# Patient Record
Sex: Female | Born: 1955 | Hispanic: No | Marital: Married | Smoking: Never smoker
Health system: Southern US, Community
[De-identification: ages and names within clinical notes are randomized; demographics above are authoritative.]

## PROBLEM LIST (undated history)

## (undated) DIAGNOSIS — D649 Anemia, unspecified: Secondary | ICD-10-CM

## (undated) DIAGNOSIS — N289 Disorder of kidney and ureter, unspecified: Secondary | ICD-10-CM

## (undated) DIAGNOSIS — E7800 Pure hypercholesterolemia, unspecified: Secondary | ICD-10-CM

## (undated) DIAGNOSIS — I10 Essential (primary) hypertension: Secondary | ICD-10-CM

## (undated) DIAGNOSIS — J189 Pneumonia, unspecified organism: Secondary | ICD-10-CM

## (undated) DIAGNOSIS — E0590 Thyrotoxicosis, unspecified without thyrotoxic crisis or storm: Secondary | ICD-10-CM

## (undated) HISTORY — PX: ABDOMINAL HYSTERECTOMY: SHX81

## (undated) HISTORY — DX: Essential (primary) hypertension: I10

## (undated) HISTORY — DX: Thyrotoxicosis, unspecified without thyrotoxic crisis or storm: E05.90

## (undated) HISTORY — DX: Disorder of kidney and ureter, unspecified: N28.9

## (undated) HISTORY — DX: Pure hypercholesterolemia, unspecified: E78.00

---

## 2020-03-08 ENCOUNTER — Ambulatory Visit: Payer: Self-pay | Admitting: "Endocrinology

## 2020-11-18 ENCOUNTER — Other Ambulatory Visit (HOSPITAL_COMMUNITY)
Admission: RE | Admit: 2020-11-18 | Discharge: 2020-11-18 | Disposition: A | Discharge status: Home / Self Care | Payer: PRIVATE HEALTH INSURANCE | Source: Other Acute Inpatient Hospital | Attending: Nephrology | Admitting: Nephrology

## 2020-11-18 DIAGNOSIS — N170 Acute kidney failure with tubular necrosis: Secondary | ICD-10-CM | POA: Insufficient documentation

## 2020-11-18 LAB — RENAL FUNCTION PANEL
Albumin: 4.3 g/dL (ref 3.5–5.0)
Anion gap: 8 (ref 5–15)
BUN: 19 mg/dL (ref 8–23)
CO2: 28 mmol/L (ref 22–32)
Calcium: 10.5 mg/dL — ABNORMAL HIGH (ref 8.9–10.3)
Chloride: 104 mmol/L (ref 98–111)
Creatinine, Ser: 1.31 mg/dL — ABNORMAL HIGH (ref 0.44–1.00)
GFR, Estimated: 46 mL/min — ABNORMAL LOW (ref >60)
Glucose, Bld: 87 mg/dL (ref 70–99)
Phosphorus: 2.1 mg/dL — ABNORMAL LOW (ref 2.5–4.6)
Potassium: 3.5 mmol/L (ref 3.5–5.1)
Sodium: 140 mmol/L (ref 135–145)

## 2020-11-25 ENCOUNTER — Encounter: Payer: Self-pay | Admitting: Urology

## 2020-11-25 ENCOUNTER — Ambulatory Visit (INDEPENDENT_AMBULATORY_CARE_PROVIDER_SITE_OTHER): Payer: PRIVATE HEALTH INSURANCE | Admitting: Urology

## 2020-11-25 ENCOUNTER — Other Ambulatory Visit: Payer: Self-pay

## 2020-11-25 VITALS — BP 144/76 | HR 80 | Temp 98.2°F | Ht 63.0 in | Wt 177.0 lb

## 2020-11-25 DIAGNOSIS — N200 Calculus of kidney: Secondary | ICD-10-CM | POA: Diagnosis not present

## 2020-11-25 DIAGNOSIS — N1330 Unspecified hydronephrosis: Secondary | ICD-10-CM | POA: Diagnosis not present

## 2020-11-25 DIAGNOSIS — N281 Cyst of kidney, acquired: Secondary | ICD-10-CM

## 2020-11-25 DIAGNOSIS — R8271 Bacteriuria: Secondary | ICD-10-CM

## 2020-11-25 DIAGNOSIS — N201 Calculus of ureter: Secondary | ICD-10-CM

## 2020-11-25 LAB — URINALYSIS, ROUTINE W REFLEX MICROSCOPIC
Bilirubin, UA: NEGATIVE
Glucose, UA: NEGATIVE
Ketones, UA: NEGATIVE
Nitrite, UA: NEGATIVE
RBC, UA: NEGATIVE
Specific Gravity, UA: 1.015 (ref 1.005–1.030)
Urobilinogen, Ur: 0.2 mg/dL (ref 0.2–1.0)
pH, UA: 6.5 (ref 5.0–7.5)

## 2020-11-25 LAB — MICROSCOPIC EXAMINATION: RBC, Urine: NONE SEEN /hpf (ref 0–2)

## 2020-11-25 NOTE — Progress Notes (Signed)
H&P  Chief Complaint: Hydronephrosis, renal stones, ureteral stones, renal nodule/cyst  History of Present Illness:  Charlene Kennedy is a complex urologic patient referred for above issues. She did not bring any of her images. She was referred to nephrology, Dr. Theador Hawthorne for creatinine of 1.3-1.6. Ultrasound was done at [place] 11/23/2020 which revealed moderate right-sided hydronephrosis and dilation of the proximal ureter with right-sided cortical thinning. There was also a 1.3 cm cyst within the interpolar of the left kidney as well as a thinly septated cyst in the lower pole of the left kidney measuring 1.2 cm. There were small scattered echogenic foci in both kidneys possible stones. Of note the ultrasound report mentions comparison to a 01/11/2020 renal ultrasound that also showed right hydronephrosis.  She was referred to [facility].   There is report in the nephrology notes of a Sep 2021 CT scan C/A/P showing patient had 2 large stones on the right kidney and marked hydroureter with advanced obstructive uropathy of the right calyceal system. There is hydroureteral development complete to the right vesicoureteral junction. There are 2 stones in the distal right ureter. On the left kidney it showed patient had 20 mm solid mass and nephrolithiasis. The left kidney is negative for obstruction. There is upper medial demonstration of solid well-circumscribed 20 mm mass. Nephrolithiasis was noted. Interestingly, she got this scan on her own from a company in [place].   She has never has any kidney stones or passed a stone.   UA today with no red blood cells. 6-10 white cells. Many bacteria. Yeast present. No gross hematuria. She has no dysuria.       Past Medical History:  Diagnosis Date  . High cholesterol   . Hypertension   . Hyperthyroidism   . Kidney disease    History reviewed. No pertinent surgical history.  Home Medications:  (Not in a hospital admission)  Allergies:  Allergies  Allergen  Reactions  . Ace Inhibitors Cough    History reviewed. No pertinent family history. Social History:  reports that she has never smoked. She has never used smokeless tobacco. No history on file for alcohol use and drug use.  ROS: A complete review of systems was performed.  All systems are negative except for pertinent findings as noted. ROS   Physical Exam:  Vital signs in last 24 hours: @VSRANGES @ General:  Alert and oriented, No acute distress HEENT: Normocephalic, atraumatic Neck: No JVD or lymphadenopathy Cardiovascular: Regular rate and rhythm Lungs: Regular rate and effort Abdomen: Soft, nontender, nondistended, no abdominal masses Back: No CVA tenderness Extremities: No edema Neurologic: Grossly intact  Laboratory Data:  No results found for this or any previous visit (from the past 24 hour(s)). No results found for this or any previous visit (from the past 240 hour(s)). Creatinine: Recent Labs    11/18/20 1217  CREATININE 1.31*    Impression/Assessment/plan:  #1 right hydronephrosis-this sounds longstanding, could be from right distal stones. We discussed the parenchymal thinning and she could have long standing obstruction which might not improve with stone removal.   #2 right ureteral stones-check KUB. We discussed the nature risks benefits and alternatives to continued surveillance, shockwave lithotripsy or ureteroscopy with laser lithotripsy and stent. We will plan 1 of these procedures of stones remain present. She would like to proceed with ESWL if feasible.   #3 right and left renal stones-check KUB  #4 bacteriuria- urine sent for culture.  #5 renal mass/cyst- CT describes more of a mass, ultrasound seems to confirm  more of a cyst. These lesions are less than 2 cm. Will monitor. Disc nature of solid and cystic mass - surv, ablation, partial etc. Pt instructed to bring [place] and CT disc so that I can review.   [name] 11/25/2020, 11:49 AM

## 2020-11-25 NOTE — Progress Notes (Signed)
Urological Symptom Review  Patient is experiencing the following symptoms: Get up at night to urinate Weak stream  Kidney stones  Review of Systems  Gastrointestinal (upper)  : Negative for upper GI symptoms  Gastrointestinal (lower) : Negative for lower GI symptoms  Constitutional : Fatigue  Skin: Negative for skin symptoms  Eyes: Negative for eye symptoms  Ear/Nose/Throat : Negative for Ear/Nose/Throat symptoms  Hematologic/Lymphatic: Negative for Hematologic/Lymphatic symptoms  Cardiovascular : Negative for cardiovascular symptoms  Respiratory : Negative for respiratory symptoms  Endocrine: Negative for endocrine symptoms  Musculoskeletal: Negative for musculoskeletal symptoms  Neurological: Negative for neurological symptoms  Psychologic: Negative for psychiatric symptoms

## 2020-11-25 NOTE — Patient Instructions (Signed)
Hydronephrosis  Hydronephrosis is the swelling of one or both kidneys due to a blockage that stops urine from flowing out of the body. Kidneys filter waste from the blood and produce urine. This condition can lead to kidney failure and may become life threatening if not treated promptly. What are the causes? Common causes of this condition include:  Problems that occur when a baby is developing in the womb (congenital defect). These can include problems: ? In the kidneys. ? In the tubes that drain urine from the kidneys into the bladder (ureters).  Kidney stones.  Bladder infection.  An enlarged prostate gland.  Scar tissue from a previous surgery or injury.  A blood clot.  A tumor or cyst in the abdomen or pelvis.  Cancer of the prostate, bladder, uterus, ovary, or colon. What are the signs or symptoms? Symptoms of this condition include:  Pain or discomfort in your side (flank).  Pain and swelling in your abdomen.  Nausea and vomiting.  Fever.  Pain when passing urine.  Feelings of urgency when you need to urinate.  Urinating more often than normal. In some cases, you may not have any symptoms. How is this diagnosed? This condition may be diagnosed based on:  Your symptoms and medical history.  A physical exam.  Blood and urine tests.  Imaging tests, such as an ultrasound, CT scan, or MRI.  A procedure in which a scope is inserted into the urethra and used to view parts of the urinary tract and bladder (cystoscopy). How is this treated? Treatment for this condition depends on where the blockage is, how long it has been there, and what caused it. The goal of treatment is to remove the blockage. Treatment may include:  Antibiotic medicines to treat or prevent infection.  A procedure to place a small, thin tube (stent) into a blocked ureter. The stent will keep the ureter open so that urine can drain through it.  A nonsurgical procedure that crushes kidney  stones with shock waves (extracorporeal shock wave lithotripsy).  If kidney failure occurs, treatment may include dialysis or a kidney transplant. Follow these instructions at home:   Take over-the-counter and prescription medicines only as told by your health care provider.  Rest and return to your normal activities as told by your health care provider. Ask your health care provider what activities are safe for you.  Drink enough fluid to keep your urine pale yellow.  If you were prescribed an antibiotic medicine, take it exactly as told by your health care provider. Do not stop taking the antibiotic even if you start to feel better.  Keep all follow-up visits as told by your health care provider. This is important. Contact a health care provider if:  You continue to have symptoms after treatment.  You develop new symptoms.  Your urine becomes cloudy or bloody.  You have a fever. Get help right away if:  You have severe flank or abdominal pain.  You cannot drink fluids without vomiting. Summary  Hydronephrosis is the swelling of one or both kidneys due to a blockage that stops urine from flowing out of the body.  Hydronephrosis can lead to kidney failure and may become life threatening if not treated promptly.  The goal of treatment is to treat the cause of the blockage. It may include insertion of stent into a blocked ureter, a procedure to treat kidney stones, and antibiotic medicines.  Follow your health care provider's instructions for taking care of yourself at   home, including instructions about drinking fluids, taking medicines, and limiting activities. This information is not intended to replace advice given to you by your health care provider. Make sure you discuss any questions you have with your health care provider. Document Revised: 11/16/2017 Document Reviewed: 11/16/2017 Elsevier Patient Education  2020 [place]. Kidney Stones Kidney stones are rock-like  masses that form inside of the kidneys. Kidneys are organs that make pee (urine). A kidney stone may move into other parts of the urinary tract, including:  The tubes that connect the kidneys to the bladder (ureters).  The bladder.  The tube that carries urine out of the body (urethra). Kidney stones can cause very bad pain and can block the flow of pee. The stone usually leaves your body (passes) through your pee. You may need to have a doctor take out the stone. What are the causes? Kidney stones may be caused by:  A condition in which certain glands make too much parathyroid hormone (primary hyperparathyroidism).  A buildup of a type of crystals in the bladder made of a chemical called uric acid. The body makes uric acid when you eat certain foods.  Narrowing (stricture) of one or both of the ureters.  A kidney blockage that you were born with.  Past surgery on the kidney or the ureters, such as gastric bypass surgery. What increases the risk? You are more likely to develop this condition if:  You have had a kidney stone in the past.  You have a family history of kidney stones.  You do not drink enough water.  You eat a diet that is high in protein, salt (sodium), or sugar.  You are overweight or very overweight (obese). What are the signs or symptoms? Symptoms of a kidney stone may include:  Pain in the side of the belly, right below the ribs (flank pain). Pain usually spreads (radiates) to the groin.  Needing to pee often or right away (urgently).  Pain when going pee (urinating).  Blood in your pee (hematuria).  Feeling like you may vomit (nauseous).  Vomiting.  Fever and chills. How is this treated? Treatment depends on the size, location, and makeup of the kidney stones. The stones will often pass out of the body through peeing. You may need to:  Drink more fluid to help pass the stone. In some cases, you may be given fluids through an IV tube put into one  of your veins at the hospital.  Take medicine for pain.  Make changes in your diet to help keep kidney stones from coming back. Sometimes, medical procedures are needed to remove a kidney stone. This may involve:  A procedure to break up kidney stones using a beam of light (laser) or shock waves.  Surgery to remove the kidney stones. Follow these instructions at home: Medicines  Take over-the-counter and prescription medicines only as told by your doctor.  Ask your doctor if the medicine prescribed to you requires you to avoid driving or using heavy machinery. Eating and drinking  Drink enough fluid to keep your pee pale yellow. You may be told to drink at least 8-10 glasses of water each day. This will help you pass the stone.  If told by your doctor, change your diet. This may include: ? Limiting how much salt you eat. ? Eating more fruits and vegetables. ? Limiting how much meat, poultry, fish, and eggs you eat.  Follow instructions from your doctor about eating or drinking restrictions. General instructions  Collect pee samples as told by your doctor. You may need to collect a pee sample: ? 24 hours after a stone comes out. ? 8-12 weeks after a stone comes out, and every 6-12 months after that.  Strain your pee every time you pee (urinate), for as long as told. Use the strainer that your doctor recommends.  Do not throw out the stone. Keep it so that it can be tested by your doctor.  Keep all follow-up visits as told by your doctor. This is important. You may need follow-up tests. How is this prevented? To prevent another kidney stone:  Drink enough fluid to keep your pee pale yellow. This is the best way to prevent kidney stones.  Eat healthy foods.  Avoid certain foods as told by your doctor. You may be told to eat less protein.  Stay at a healthy weight. Where to find more information  [place] ([facility]): www.kidney.[place]  [facility]): www.urologyhealth.org Contact a doctor if:  You have pain that gets worse or does not get better with medicine. Get help right away if:  You have a fever or chills.  You get very bad pain.  You get new pain in your belly (abdomen).  You pass out (faint).  You cannot pee. Summary  Kidney stones are rock-like masses that form inside of the kidneys.  Kidney stones can cause very bad pain and can block the flow of pee.  The stones will often pass out of the body through peeing.  Drink enough fluid to keep your pee pale yellow. This information is not intended to replace advice given to you by your health care provider. Make sure you discuss any questions you have with your health care provider. Document Revised: 03/24/2019 Document Reviewed: 03/24/2019 Elsevier Patient Education  [place].

## 2020-11-27 LAB — URINE CULTURE

## 2020-11-29 ENCOUNTER — Telehealth: Payer: Self-pay

## 2020-11-29 NOTE — Telephone Encounter (Signed)
Pt notified urine cx was negative.

## 2020-11-29 NOTE — Telephone Encounter (Signed)
-----   Message from Cleon Gustin, MD sent at 11/29/2020 10:29 AM EST ----- negative ----- Message ----- From: Valentina Lucks, LPN Sent: 5/88/5027   9:26 AM EST To: Cleon Gustin, MD  Pls review.

## 2020-11-30 ENCOUNTER — Other Ambulatory Visit (HOSPITAL_COMMUNITY): Payer: Self-pay | Admitting: Nephrology

## 2020-11-30 ENCOUNTER — Telehealth: Payer: Self-pay

## 2020-11-30 DIAGNOSIS — E210 Primary hyperparathyroidism: Secondary | ICD-10-CM

## 2020-11-30 NOTE — Telephone Encounter (Signed)
-----   Message from [name], MD sent at 11/30/2020  9:19 AM EST ----- Charlene Kennedy, please have Charlene Kennedy get a KUB this week or ASAP at [facility]. I might need to set her up for ESWL or ureteroscopy. Thanks!  Dr Johnette Abraham

## 2020-11-30 NOTE — Telephone Encounter (Signed)
Pt called and notified of needing KUB prior to moving forward with procedures. Pt asking if this could be done at [facility] and I instructed the patient we are unable to access images done through [place] and Dr. Junious Silk stressed her xray needed to be done at a [facility] facility- [facility] being closet.  Pt states she can not promise she will go but will try.

## 2020-12-01 ENCOUNTER — Other Ambulatory Visit: Payer: Self-pay

## 2020-12-01 ENCOUNTER — Ambulatory Visit (HOSPITAL_COMMUNITY)
Admission: RE | Admit: 2020-12-01 | Discharge: 2020-12-01 | Disposition: A | Discharge status: Home / Self Care | Payer: PRIVATE HEALTH INSURANCE | Source: Ambulatory Visit | Attending: Urology | Admitting: Urology

## 2020-12-01 DIAGNOSIS — N200 Calculus of kidney: Secondary | ICD-10-CM

## 2020-12-01 DIAGNOSIS — N201 Calculus of ureter: Secondary | ICD-10-CM

## 2020-12-07 NOTE — Telephone Encounter (Signed)
Message left for patient to call to discuss the kub findings and ask patient if she would like to proceed with the doctors recommendations.

## 2020-12-07 NOTE — Telephone Encounter (Signed)
Please see note if patient returns call today.

## 2020-12-07 NOTE — Telephone Encounter (Signed)
Patient would like to proceed with ureteroscopy- she asked that you call her to discuss the actual surgery. 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

## 2020-12-09 ENCOUNTER — Ambulatory Visit: Payer: PRIVATE HEALTH INSURANCE | Admitting: Urology

## 2020-12-09 ENCOUNTER — Encounter (HOSPITAL_COMMUNITY): Payer: PRIVATE HEALTH INSURANCE

## 2020-12-09 ENCOUNTER — Encounter (HOSPITAL_COMMUNITY): Payer: Self-pay

## 2020-12-13 NOTE — Telephone Encounter (Signed)
I spoke with this patient.  I answered the general questions about surgery and when we could schedule- she is on your schedule 2/4 to address additional questions regarding the procedure Dr. Junious Silk has suggested she have.   I can have her scheduled for 2/23 for this procedure but she would like to speak to you before.

## 2020-12-23 ENCOUNTER — Ambulatory Visit (INDEPENDENT_AMBULATORY_CARE_PROVIDER_SITE_OTHER): Payer: PRIVATE HEALTH INSURANCE | Admitting: Urology

## 2020-12-23 ENCOUNTER — Ambulatory Visit (HOSPITAL_COMMUNITY)
Admission: RE | Admit: 2020-12-23 | Discharge: 2020-12-23 | Disposition: A | Discharge status: Home / Self Care | Payer: PRIVATE HEALTH INSURANCE | Source: Ambulatory Visit | Attending: Urology | Admitting: Urology

## 2020-12-23 ENCOUNTER — Encounter: Payer: Self-pay | Admitting: Urology

## 2020-12-23 ENCOUNTER — Other Ambulatory Visit: Payer: Self-pay

## 2020-12-23 ENCOUNTER — Ambulatory Visit: Payer: PRIVATE HEALTH INSURANCE | Admitting: Urology

## 2020-12-23 VITALS — BP 123/74 | HR 68 | Temp 98.6°F | Ht 63.0 in | Wt 175.0 lb

## 2020-12-23 DIAGNOSIS — N1330 Unspecified hydronephrosis: Secondary | ICD-10-CM | POA: Diagnosis present

## 2020-12-23 LAB — URINALYSIS, ROUTINE W REFLEX MICROSCOPIC
Bilirubin, UA: NEGATIVE
Glucose, UA: NEGATIVE
Ketones, UA: NEGATIVE
Nitrite, UA: NEGATIVE
Specific Gravity, UA: 1.015 (ref 1.005–1.030)
Urobilinogen, Ur: 0.2 mg/dL (ref 0.2–1.0)
pH, UA: 7 (ref 5.0–7.5)

## 2020-12-23 LAB — MICROSCOPIC EXAMINATION
Epithelial Cells (non renal): >10 /hpf — AB (ref 0–10)
Renal Epithel, UA: NONE SEEN /hpf

## 2020-12-23 NOTE — H&P (View-Only) (Signed)
12/23/2020 12:27 PM   Charlene Kennedy 04/26/1956 [id number]  Referring provider: No referring provider defined for this encounter.  Right hydronephrosis  HPI: Ms Charlene Kennedy is a 65yo here for evaluation of right hydronephrosis. She has a hx of nephrolithiasis and had a renal US in Dec which showed right hydronephrosis. KUB from 11/2020 showed bilateral renal calculi. She has mild intermittent right flank pain.    PMH: Past Medical History:  Diagnosis Date  . High cholesterol   . Hypertension   . Hyperthyroidism   . Kidney disease     Surgical History: History reviewed. No pertinent surgical history.  Home Medications:  Allergies as of 12/23/2020      Reactions   Ace Inhibitors Cough      Medication List       Accurate as of December 23, 2020 12:27 PM. If you have any questions, ask your nurse or doctor.        STOP taking these medications   cetirizine 10 MG tablet Commonly known as: ZYRTEC Stopped by: Nicolette Bang, MD   cinacalcet 30 MG tablet Commonly known as: SENSIPAR Stopped by: Nicolette Bang, MD     TAKE these medications   amLODipine 10 MG tablet Commonly known as: NORVASC Take 10 mg by mouth daily.   aspirin 81 MG EC tablet Aspir-81 mg tablet,delayed release   Cholecalciferol 1.25 MG (50000 UT) capsule cholecalciferol (vitamin D3) 1,250 mcg (50,000 unit) capsule  TAKE ONE CAPSULE BY MOUTH EVERY WEEK   cloNIDine 0.1 MG tablet Commonly known as: CATAPRES Take 0.1 mg by mouth 2 (two) times daily.   fluticasone 50 MCG/ACT nasal spray Commonly known as: FLONASE   olmesartan 40 MG tablet Commonly known as: BENICAR Take 40 mg by mouth daily.       Allergies:  Allergies  Allergen Reactions  . Ace Inhibitors Cough    Family History: History reviewed. No pertinent family history.  Social History:  reports that she has never smoked. She has never used smokeless tobacco. No history on file for alcohol use and drug use.  ROS: All  other review of systems were reviewed and are negative except what is noted above in HPI  Physical Exam: BP 123/74   Pulse 68   Temp 98.6 F (37 C)   Ht 5\' 3"  (1.6 m)   Wt 175 lb (79.4 kg)   BMI 31.00 kg/m   Constitutional:  Alert and oriented, No acute distress. HEENT:  AT, moist mucus membranes.  Trachea midline, no masses. Cardiovascular: No clubbing, cyanosis, or edema. Respiratory: Normal respiratory effort, no increased work of breathing. GI: Abdomen is soft, nontender, nondistended, no abdominal masses GU: No CVA tenderness.  Lymph: No cervical or inguinal lymphadenopathy. Skin: No rashes, bruises or suspicious lesions. Neurologic: Grossly intact, no focal deficits, moving all 4 extremities. Psychiatric: Normal mood and affect.  Laboratory Data: No results found for: WBC, HGB, HCT, MCV, PLT  Lab Results  Component Value Date   CREATININE 1.31 (H) 11/18/2020    No results found for: PSA  No results found for: TESTOSTERONE  No results found for: HGBA1C  Urinalysis    Component Value Date/Time   APPEARANCEUR Cloudy (A) 11/25/2020 1115   GLUCOSEU Negative 11/25/2020 1115   BILIRUBINUR Negative 11/25/2020 1115   PROTEINUR 1+ (A) 11/25/2020 1115   NITRITE Negative 11/25/2020 1115   LEUKOCYTESUR 1+ (A) 11/25/2020 1115    Lab Results  Component Value Date   LABMICR See below: 11/25/2020   WBCUA 6-10 (A)  11/25/2020   LABEPIT 0-10 11/25/2020   BACTERIA Many (A) 11/25/2020    Pertinent Imaging: KUB 12/01/2020: IMages reviewed and discussed with the patient Results for orders placed during the hospital encounter of 12/01/20  DG Abd 1 View  Narrative CLINICAL DATA:  Ureteral stone.  Abdominal pain.  EXAM: ABDOMEN - 1 VIEW  COMPARISON:  No prior.  FINDINGS: Soft tissue structures are unremarkable. Stool noted throughout the colon. No bowel distention or free air. Multiple bilateral renal stones and or nephrocalcinosis. Multiple prominent  pelvic calcifications are noted. Although these may represent phleboliths, distal ureteral stones cannot be excluded. Calcifications in the right lower pelvis are particularly prominent with the largest measuring 1.2 cm. The possibility of a calcified adnexal process including dermoid cannot be excluded. Pelvic ultrasound can be obtained for further evaluation as needed. Small sclerotic density noted over the left pelvis, most likely benign bone island. Thoracolumbar spine scoliosis. Degenerative change lumbar spine and both hips.  IMPRESSION: 1. Multiple bilateral renal stones and or nephrocalcinosis. 2. Multiple prominent pelvic calcifications are noted. Although these may represent phleboliths, distal ureteral stones cannot be excluded. Calcifications in the right lower pelvis are particularly prominent with the largest measuring 1.2 cm. The possibility of a calcified adnexal process including dermoid cannot be excluded. Pelvic ultrasound can be obtained for further evaluation.   Electronically Signed By: Marcello Moores  Register On: 12/01/2020 10:23  No results found for this or any previous visit.  No results found for this or any previous visit.  No results found for this or any previous visit.  No results found for this or any previous visit.  No results found for this or any previous visit.  No results found for this or any previous visit.  No results found for this or any previous visit.   Assessment & Plan:    1. Hydronephrosis, unspecified hydronephrosis type -CT stone study to evaluate for ureteral calculi.  - Urinalysis, Routine w reflex microscopic   No follow-ups on file.  Nicolette Bang, MD  [facility] [place]

## 2020-12-23 NOTE — Progress Notes (Signed)
Urological Symptom Review  Patient is experiencing the following symptoms: Kidney stones   Review of Systems  Gastrointestinal (upper)  : Negative for upper GI symptoms  Gastrointestinal (lower) : Negative for lower GI symptoms  Constitutional : Negative for symptoms  Skin: Negative for skin symptoms  Eyes: Negative for eye symptoms  Ear/Nose/Throat : Negative for Ear/Nose/Throat symptoms  Hematologic/Lymphatic: Negative for Hematologic/Lymphatic symptoms  Cardiovascular : Negative for cardiovascular symptoms  Respiratory : Negative for respiratory symptoms  Endocrine: Negative for endocrine symptoms  Musculoskeletal: Negative for musculoskeletal symptoms  Neurological: Negative for neurological symptoms  Psychologic: Negative for psychiatric symptoms

## 2020-12-23 NOTE — Progress Notes (Signed)
 12/23/2020 12:27 PM   Charlene Kennedy 08/07/1956 [id number]  Referring provider: No referring provider defined for this encounter.  Right hydronephrosis  HPI: Charlene Kennedy is a 64yo here for evaluation of right hydronephrosis. She has a hx of nephrolithiasis and had a renal US in Dec which showed right hydronephrosis. KUB from 11/2020 showed bilateral renal calculi. She has mild intermittent right flank pain.    PMH: Past Medical History:  Diagnosis Date  . High cholesterol   . Hypertension   . Hyperthyroidism   . Kidney disease     Surgical History: History reviewed. No pertinent surgical history.  Home Medications:  Allergies as of 12/23/2020      Reactions   Ace Inhibitors Cough      Medication List       Accurate as of December 23, 2020 12:27 PM. If you have any questions, ask your nurse or doctor.        STOP taking these medications   cetirizine 10 MG tablet Commonly known as: ZYRTEC Stopped by: Jocelin Schuelke, MD   cinacalcet 30 MG tablet Commonly known as: SENSIPAR Stopped by: Joselin Crandell, MD     TAKE these medications   amLODipine 10 MG tablet Commonly known as: NORVASC Take 10 mg by mouth daily.   aspirin 81 MG EC tablet Aspir-81 mg tablet,delayed release   Cholecalciferol 1.25 MG (50000 UT) capsule cholecalciferol (vitamin D3) 1,250 mcg (50,000 unit) capsule  TAKE ONE CAPSULE BY MOUTH EVERY WEEK   cloNIDine 0.1 MG tablet Commonly known as: CATAPRES Take 0.1 mg by mouth 2 (two) times daily.   fluticasone 50 MCG/ACT nasal spray Commonly known as: FLONASE   olmesartan 40 MG tablet Commonly known as: BENICAR Take 40 mg by mouth daily.       Allergies:  Allergies  Allergen Reactions  . Ace Inhibitors Cough    Family History: History reviewed. No pertinent family history.  Social History:  reports that she has never smoked. She has never used smokeless tobacco. No history on file for alcohol use and drug use.  ROS: All  other review of systems were reviewed and are negative except what is noted above in HPI  Physical Exam: BP 123/74   Pulse 68   Temp 98.6 F (37 C)   Ht 5' 3" (1.6 m)   Wt 175 lb (79.4 kg)   BMI 31.00 kg/m   Constitutional:  Alert and oriented, No acute distress. HEENT: [place] AT, moist mucus membranes.  Trachea midline, no masses. Cardiovascular: No clubbing, cyanosis, or edema. Respiratory: Normal respiratory effort, no increased work of breathing. GI: Abdomen is soft, nontender, nondistended, no abdominal masses GU: No CVA tenderness.  Lymph: No cervical or inguinal lymphadenopathy. Skin: No rashes, bruises or suspicious lesions. Neurologic: Grossly intact, no focal deficits, moving all 4 extremities. Psychiatric: Normal mood and affect.  Laboratory Data: No results found for: WBC, HGB, HCT, MCV, PLT  Lab Results  Component Value Date   CREATININE 1.31 (H) 11/18/2020    No results found for: PSA  No results found for: TESTOSTERONE  No results found for: HGBA1C  Urinalysis    Component Value Date/Time   APPEARANCEUR Cloudy (A) 11/25/2020 1115   GLUCOSEU Negative 11/25/2020 1115   BILIRUBINUR Negative 11/25/2020 1115   PROTEINUR 1+ (A) 11/25/2020 1115   NITRITE Negative 11/25/2020 1115   LEUKOCYTESUR 1+ (A) 11/25/2020 1115    Lab Results  Component Value Date   LABMICR See below: 11/25/2020   WBCUA 6-10 (A)   11/25/2020   LABEPIT 0-10 11/25/2020   BACTERIA Many (A) 11/25/2020    Pertinent Imaging: KUB 12/01/2020: IMages reviewed and discussed with the patient Results for orders placed during the hospital encounter of 12/01/20  DG Abd 1 View  Narrative CLINICAL DATA:  Ureteral stone.  Abdominal pain.  EXAM: ABDOMEN - 1 VIEW  COMPARISON:  No prior.  FINDINGS: Soft tissue structures are unremarkable. Stool noted throughout the colon. No bowel distention or free air. Multiple bilateral renal stones and or nephrocalcinosis. Multiple prominent  pelvic calcifications are noted. Although these may represent phleboliths, distal ureteral stones cannot be excluded. Calcifications in the right lower pelvis are particularly prominent with the largest measuring 1.2 cm. The possibility of a calcified adnexal process including dermoid cannot be excluded. Pelvic ultrasound can be obtained for further evaluation as needed. Small sclerotic density noted over the left pelvis, most likely benign bone island. Thoracolumbar spine scoliosis. Degenerative change lumbar spine and both hips.  IMPRESSION: 1. Multiple bilateral renal stones and or nephrocalcinosis. 2. Multiple prominent pelvic calcifications are noted. Although these may represent phleboliths, distal ureteral stones cannot be excluded. Calcifications in the right lower pelvis are particularly prominent with the largest measuring 1.2 cm. The possibility of a calcified adnexal process including dermoid cannot be excluded. Pelvic ultrasound can be obtained for further evaluation.   Electronically Signed By: Marcello Moores  Register On: 12/01/2020 10:23  No results found for this or any previous visit.  No results found for this or any previous visit.  No results found for this or any previous visit.  No results found for this or any previous visit.  No results found for this or any previous visit.  No results found for this or any previous visit.  No results found for this or any previous visit.   Assessment & Plan:    1. Hydronephrosis, unspecified hydronephrosis type -CT stone study to evaluate for ureteral calculi.  - Urinalysis, Routine w reflex microscopic   No follow-ups on file.  Nicolette Bang, MD  [facility] [place]

## 2021-01-16 NOTE — Patient Instructions (Signed)
Charlene Kennedy  01/16/2021     @PREFPERIOPPHARMACY @   Your procedure is scheduled on 01/19/2021   Report to [facility] at  1300 (1:00)  P.M.   Call this number if you have problems the morning of surgery:  [phone]   Remember:  Do not eat or drink after midnight.                        Take these medicines the morning of surgery with A SIP OF WATER  Amlodipine, clonidine.     Do not wear jewelry, make-up or nail polish.  Do not wear lotions, powders, or perfumes, or deodorant.  Do not shave 48 hours prior to surgery.  Men may shave face and neck.  Do not bring valuables to the hospital.  [facility] is not responsible for any belongings or valuables.  Contacts, dentures or bridgework may not be worn into surgery.  Leave your suitcase in the car.  After surgery it may be brought to your room.  For patients admitted to the hospital, discharge time will be determined by your treatment team.  Patients discharged the day of surgery will not be allowed to drive home and must have someone with th em for 24 hours.  Place clean sheets on your bed the night before your surgery. DO NOT sleep with pets this night.  Shower the morning of your surgery. After your shower, dry off with a clean towel, put in clean, comfortable clothing and brush your teeth.    Special instructions:    DO NOT smoke tobacco or vape the morning of your procedure.   Please read over the following fact sheets that you were given. Coughing and Deep Breathing, Surgical Site Infection Prevention, Anesthesia Post-op Instructions and Care and Recovery After Surgery       Ureteral Stent Implantation, Care After This sheet gives you information about how to care for yourself after your procedure. Your health care provider may also give you more specific instructions. If you have problems or questions, contact your health care provider. What can I expect after the procedure? After the procedure, it is  common to have:  Nausea.  Mild pain when you urinate. You may feel this pain in your lower back or lower abdomen. The pain should stop within a few minutes after you urinate. This may last for up to 1 week.  A small amount of blood in your urine for several days. Follow these instructions at home: Medicines  Take over-the-counter and prescription medicines only as told by your health care provider.  If you were prescribed an antibiotic medicine, take it as told by your health care provider. Do not stop taking the antibiotic even if you start to feel better.  Do not drive for 24 hours if you were given a sedative during your procedure.  Ask your health care provider if the medicine prescribed to you requires you to avoid driving or using heavy machinery. Activity  Rest as told by your health care provider.  Avoid sitting for a long time without moving. Get up to take short walks every 1-2 hours. This is important to improve blood flow and breathing. Ask for help if you feel weak or unsteady.  Return to your normal activities as told by your health care provider. Ask your health care provider what activities are safe for you. General instructions  Watch for any blood in your urine. Call your  health care provider if the amount of blood in your urine increases.  If you have a catheter: ? Follow instructions from your health care provider about taking care of your catheter and collection bag. ? Do not take baths, swim, or use a hot tub until your health care provider approves. Ask your health care provider if you may take showers. You may only be allowed to take sponge baths.  Drink enough fluid to keep your urine pale yellow.  Do not use any products that contain nicotine or tobacco, such as cigarettes, e-cigarettes, and chewing tobacco. These can delay healing after surgery. If you need help quitting, ask your health care provider.  Keep all follow-up visits as told by your health  care provider. This is important.   Contact a health care provider if:  You have pain that gets worse or does not get better with medicine, especially pain when you urinate.  You have difficulty urinating.  You feel nauseous or you vomit repeatedly during a period of more than 2 days after the procedure. Get help right away if:  Your urine is dark red or has blood clots in it.  You are leaking urine (have incontinence).  The end of the stent comes out of your urethra.  You cannot urinate.  You have sudden, sharp, or severe pain in your abdomen or lower back.  You have a fever.  You have swelling or pain in your legs.  You have difficulty breathing. Summary  After the procedure, it is common to have mild pain when you urinate that goes away within a few minutes after you urinate. This may last for up to 1 week.  Watch for any blood in your urine. Call your health care provider if the amount of blood in your urine increases.  Take over-the-counter and prescription medicines only as told by your health care provider.  Drink enough fluid to keep your urine pale yellow. This information is not intended to replace advice given to you by your health care provider. Make sure you discuss any questions you have with your health care provider. Document Revised: 08/12/2018 Document Reviewed: 08/13/2018 Elsevier Patient Education  2021 [place] After This sheet gives you information about how to care for yourself after your procedure. Your health care provider may also give you more specific instructions. If you have problems or questions, contact your health care provider. What can I expect after the procedure? After the procedure, it is common to have:  Tiredness.  Forgetfulness about what happened after the procedure.  Impaired judgment for important decisions.  Nausea or vomiting.  Some difficulty with balance. Follow these instructions  at home: For the time period you were told by your health care provider:  Rest as needed.  Do not participate in activities where you could fall or become injured.  Do not drive or use machinery.  Do not drink alcohol.  Do not take sleeping pills or medicines that cause drowsiness.  Do not make important decisions or sign legal documents.  Do not take care of children on your own.      Eating and drinking  Follow the diet that is recommended by your health care provider.  Drink enough fluid to keep your urine pale yellow.  If you vomit: ? Drink water, juice, or soup when you can drink without vomiting. ? Make sure you have little or no nausea before eating solid foods. General instructions  Have a responsible adult  stay with you for the time you are told. It is important to have someone help care for you until you are awake and alert.  Take over-the-counter and prescription medicines only as told by your health care provider.  If you have sleep apnea, surgery and certain medicines can increase your risk for breathing problems. Follow instructions from your health care provider about wearing your sleep device: ? Anytime you are sleeping, including during daytime naps. ? While taking prescription pain medicines, sleeping medicines, or medicines that make you drowsy.  Avoid smoking.  Keep all follow-up visits as told by your health care provider. This is important. Contact a health care provider if:  You keep feeling nauseous or you keep vomiting.  You feel light-headed.  You are still sleepy or having trouble with balance after 24 hours.  You develop a rash.  You have a fever.  You have redness or swelling around the IV site. Get help right away if:  You have trouble breathing.  You have new-onset confusion at home. Summary  For several hours after your procedure, you may feel tired. You may also be forgetful and have poor judgment.  Have a responsible  adult stay with you for the time you are told. It is important to have someone help care for you until you are awake and alert.  Rest as told. Do not drive or operate machinery. Do not drink alcohol or take sleeping pills.  Get help right away if you have trouble breathing, or if you suddenly become confused. This information is not intended to replace advice given to you by your health care provider. Make sure you discuss any questions you have with your health care provider. Document Revised: 07/21/2020 Document Reviewed: 10/08/2019 Elsevier Patient Education  2021 [employer].

## 2021-01-17 ENCOUNTER — Encounter (HOSPITAL_COMMUNITY): Payer: Self-pay

## 2021-01-17 ENCOUNTER — Other Ambulatory Visit (HOSPITAL_COMMUNITY)
Admission: RE | Admit: 2021-01-17 | Discharge: 2021-01-17 | Disposition: A | Discharge status: Home / Self Care | Payer: PRIVATE HEALTH INSURANCE | Source: Ambulatory Visit | Attending: Urology | Admitting: Urology

## 2021-01-17 ENCOUNTER — Other Ambulatory Visit: Payer: Self-pay

## 2021-01-17 ENCOUNTER — Encounter (HOSPITAL_COMMUNITY)
Admission: RE | Admit: 2021-01-17 | Discharge: 2021-01-17 | Disposition: A | Discharge status: Home / Self Care | Payer: PRIVATE HEALTH INSURANCE | Source: Ambulatory Visit | Attending: Urology | Admitting: Urology

## 2021-01-17 DIAGNOSIS — Z20822 Contact with and (suspected) exposure to covid-19: Secondary | ICD-10-CM | POA: Insufficient documentation

## 2021-01-17 DIAGNOSIS — Z01818 Encounter for other preprocedural examination: Secondary | ICD-10-CM | POA: Insufficient documentation

## 2021-01-18 LAB — SARS CORONAVIRUS 2 (TAT 6-24 HRS): SARS Coronavirus 2: NEGATIVE

## 2021-01-19 ENCOUNTER — Ambulatory Visit (HOSPITAL_COMMUNITY): Payer: PRIVATE HEALTH INSURANCE

## 2021-01-19 ENCOUNTER — Ambulatory Visit (HOSPITAL_COMMUNITY): Payer: PRIVATE HEALTH INSURANCE | Admitting: Anesthesiology

## 2021-01-19 ENCOUNTER — Ambulatory Visit (HOSPITAL_COMMUNITY)
Admission: RE | Admit: 2021-01-19 | Discharge: 2021-01-19 | Disposition: A | Discharge status: Home / Self Care | Payer: PRIVATE HEALTH INSURANCE | Attending: Urology | Admitting: Urology

## 2021-01-19 ENCOUNTER — Encounter (HOSPITAL_COMMUNITY)
Admission: RE | Disposition: A | Discharge status: Home / Self Care | Payer: Self-pay | Source: Home / Self Care | Attending: Urology

## 2021-01-19 ENCOUNTER — Encounter (HOSPITAL_COMMUNITY): Payer: Self-pay | Admitting: Urology

## 2021-01-19 DIAGNOSIS — Z79899 Other long term (current) drug therapy: Secondary | ICD-10-CM | POA: Insufficient documentation

## 2021-01-19 DIAGNOSIS — N201 Calculus of ureter: Secondary | ICD-10-CM | POA: Diagnosis not present

## 2021-01-19 DIAGNOSIS — N132 Hydronephrosis with renal and ureteral calculous obstruction: Secondary | ICD-10-CM | POA: Insufficient documentation

## 2021-01-19 DIAGNOSIS — Z87442 Personal history of urinary calculi: Secondary | ICD-10-CM | POA: Diagnosis not present

## 2021-01-19 DIAGNOSIS — Z888 Allergy status to other drugs, medicaments and biological substances status: Secondary | ICD-10-CM | POA: Insufficient documentation

## 2021-01-19 HISTORY — PX: CYSTOSCOPY WITH RETROGRADE PYELOGRAM, URETEROSCOPY AND STENT PLACEMENT: SHX5789

## 2021-01-19 HISTORY — PX: HOLMIUM LASER APPLICATION: SHX5852

## 2021-01-19 SURGERY — CYSTOURETEROSCOPY, WITH RETROGRADE PYELOGRAM AND STENT INSERTION
Anesthesia: General | Laterality: Right

## 2021-01-19 MED ORDER — KETOROLAC TROMETHAMINE 30 MG/ML IJ SOLN
INTRAMUSCULAR | Status: AC
  Filled 2021-01-19: qty 1

## 2021-01-19 MED ORDER — PROMETHAZINE HCL 25 MG/ML IJ SOLN: 6.2500 mg | INTRAMUSCULAR | Status: DC | PRN

## 2021-01-19 MED ORDER — SODIUM CHLORIDE 0.9 % IR SOLN
Status: DC | PRN
  Administered 2021-01-19 (×2): 3000 mL

## 2021-01-19 MED ORDER — CEFAZOLIN SODIUM-DEXTROSE 2-4 GM/100ML-% IV SOLN
INTRAVENOUS | Status: AC
  Filled 2021-01-19: qty 100

## 2021-01-19 MED ORDER — MIDAZOLAM HCL 5 MG/5ML IJ SOLN
INTRAMUSCULAR | Status: DC | PRN
  Administered 2021-01-19: 2 mg via INTRAVENOUS

## 2021-01-19 MED ORDER — FENTANYL CITRATE (PF) 100 MCG/2ML IJ SOLN
INTRAMUSCULAR | Status: DC | PRN
  Administered 2021-01-19: 100 ug via INTRAVENOUS

## 2021-01-19 MED ORDER — ORAL CARE MOUTH RINSE: 15.0000 mL | Freq: Once | OROMUCOSAL | Status: AC

## 2021-01-19 MED ORDER — ONDANSETRON HCL 4 MG PO TABS: 4.0000 mg | ORAL_TABLET | Freq: Every day | ORAL | 1 refills | Status: DC | PRN

## 2021-01-19 MED ORDER — DIATRIZOATE MEGLUMINE 30 % UR SOLN
URETHRAL | Status: AC
  Filled 2021-01-19: qty 100

## 2021-01-19 MED ORDER — MEPERIDINE HCL 50 MG/ML IJ SOLN: 6.2500 mg | INTRAMUSCULAR | Status: DC | PRN

## 2021-01-19 MED ORDER — FENTANYL CITRATE (PF) 100 MCG/2ML IJ SOLN
INTRAMUSCULAR | Status: AC
  Filled 2021-01-19: qty 2

## 2021-01-19 MED ORDER — CHLORHEXIDINE GLUCONATE 0.12 % MT SOLN
OROMUCOSAL | Status: AC
  Administered 2021-01-19: 15 mL via OROMUCOSAL
  Filled 2021-01-19: qty 15

## 2021-01-19 MED ORDER — EPHEDRINE SULFATE 50 MG/ML IJ SOLN
INTRAMUSCULAR | Status: DC | PRN
  Administered 2021-01-19: 10 mg via INTRAVENOUS

## 2021-01-19 MED ORDER — OXYCODONE-ACETAMINOPHEN 5-325 MG PO TABS: 1.0000 | ORAL_TABLET | ORAL | 0 refills | Status: DC | PRN

## 2021-01-19 MED ORDER — CEFAZOLIN SODIUM-DEXTROSE 2-4 GM/100ML-% IV SOLN
2.0000 g | INTRAVENOUS | Status: AC
  Administered 2021-01-19: 2 g via INTRAVENOUS

## 2021-01-19 MED ORDER — LACTATED RINGERS IV SOLN: INTRAVENOUS | Status: DC

## 2021-01-19 MED ORDER — PROPOFOL 10 MG/ML IV BOLUS
INTRAVENOUS | Status: DC | PRN
  Administered 2021-01-19: 200 mg via INTRAVENOUS

## 2021-01-19 MED ORDER — HYDROMORPHONE HCL 1 MG/ML IJ SOLN: 0.2500 mg | INTRAMUSCULAR | Status: DC | PRN

## 2021-01-19 MED ORDER — DIATRIZOATE MEGLUMINE 30 % UR SOLN
URETHRAL | Status: DC | PRN
  Administered 2021-01-19: 10 mL via URETHRAL

## 2021-01-19 MED ORDER — MIDAZOLAM HCL 2 MG/2ML IJ SOLN
INTRAMUSCULAR | Status: AC
  Filled 2021-01-19: qty 2

## 2021-01-19 MED ORDER — SODIUM CHLORIDE 0.9 % IV SOLN: INTRAVENOUS | Status: DC | PRN

## 2021-01-19 MED ORDER — WATER FOR IRRIGATION, STERILE IR SOLN
Status: DC | PRN
  Administered 2021-01-19: 1000 mL

## 2021-01-19 MED ORDER — CHLORHEXIDINE GLUCONATE 0.12 % MT SOLN: 15.0000 mL | Freq: Once | OROMUCOSAL | Status: AC

## 2021-01-19 MED ORDER — TAMSULOSIN HCL 0.4 MG PO CAPS: 0.4000 mg | ORAL_CAPSULE | Freq: Every day | ORAL | 0 refills | Status: DC

## 2021-01-19 SURGICAL SUPPLY — 29 items
BAG DRAIN URO TABLE W/ADPT NS (BAG) ×2 IMPLANT
BAG DRN 8 ADPR NS SKTRN CSTL (BAG) ×1
BAG HAMPER (MISCELLANEOUS) ×2 IMPLANT
CATH INTERMIT  6FR 70CM (CATHETERS) ×2 IMPLANT
CLOTH BEACON ORANGE TIMEOUT ST (SAFETY) ×2 IMPLANT
DECANTER SPIKE VIAL GLASS SM (MISCELLANEOUS) ×2 IMPLANT
EXTRACTOR STONE NITINOL NGAGE (UROLOGICAL SUPPLIES) ×2 IMPLANT
GLOVE BIO SURGEON STRL SZ8 (GLOVE) ×2 IMPLANT
GLOVE BIOGEL M STER SZ 6 (GLOVE) ×2 IMPLANT
GLOVE BIOGEL PI IND STRL 6 (GLOVE) ×1 IMPLANT
GLOVE BIOGEL PI INDICATOR 6 (GLOVE) ×1
GLOVE SURG UNDER POLY LF SZ7 (GLOVE) ×6 IMPLANT
GOWN STRL REUS W/TWL LRG LVL3 (GOWN DISPOSABLE) ×2 IMPLANT
GOWN STRL REUS W/TWL XL LVL3 (GOWN DISPOSABLE) ×2 IMPLANT
GUIDEWIRE STR DUAL SENSOR (WIRE) ×2 IMPLANT
GUIDEWIRE STR ZIPWIRE 035X150 (MISCELLANEOUS) ×2 IMPLANT
IV NS IRRIG 3000ML ARTHROMATIC (IV SOLUTION) ×4 IMPLANT
KIT TURNOVER CYSTO (KITS) ×2 IMPLANT
MANIFOLD NEPTUNE II (INSTRUMENTS) ×2 IMPLANT
PACK CYSTO (CUSTOM PROCEDURE TRAY) ×2 IMPLANT
PAD ARMBOARD 7.5X6 YLW CONV (MISCELLANEOUS) ×2 IMPLANT
STENT URET 6FRX24 CONTOUR (STENTS) ×2 IMPLANT
STENT URET 6FRX26 CONTOUR (STENTS) IMPLANT
SYR 10ML LL (SYRINGE) ×2 IMPLANT
SYR CONTROL 10ML LL (SYRINGE) ×2 IMPLANT
TOWEL OR 17X26 4PK STRL BLUE (TOWEL DISPOSABLE) ×2 IMPLANT
TRACTIP FLEXIVA PULS ID 200XHI (Laser) ×1 IMPLANT
TRACTIP FLEXIVA PULSE ID 200 (Laser) ×2
WATER STERILE IRR 500ML POUR (IV SOLUTION) ×2 IMPLANT

## 2021-01-19 NOTE — Anesthesia Preprocedure Evaluation (Addendum)
Anesthesia Evaluation  Patient identified by MRN, date of birth, ID band Patient awake    Reviewed: Allergy & Precautions, NPO status , Patient's Chart, lab work & pertinent test results  History of Anesthesia Complications Negative for: history of anesthetic complications  Airway Mallampati: II   Neck ROM: Full    Dental  (+) Dental Advisory Given, Missing   Pulmonary neg pulmonary ROS,    Pulmonary exam normal breath sounds clear to auscultation       Cardiovascular Exercise Tolerance: Good hypertension, Pt. on medications Normal cardiovascular exam Rhythm:Regular Rate:Normal  17-Jan-2021 11:38:16 [facility]-OPS ROUTINE RECORD Normal sinus rhythm Low voltage QRS Abnormal ECG No previous tracing Confirmed by Skeet Latch [phone]) on 01/17/2021 11:32:31 PM 53mm/s 77mm/mV 100Hz  9.0.4 12SL 243 CID: [id number] Referred by: Eulis Manly Confirmed By: Skeet Latch   Neuro/Psych negative neurological ROS  negative psych ROS   GI/Hepatic negative GI ROS, Neg liver ROS,   Endo/Other  Primary or secondary hyperparathyroidism  hypercalcemia  Renal/GU Renal InsufficiencyRenal disease     Musculoskeletal negative musculoskeletal ROS (+)   Abdominal   Peds  Hematology negative hematology ROS (+)   Anesthesia Other Findings   Reproductive/Obstetrics negative OB ROS                            Anesthesia Physical Anesthesia Plan  ASA: II  Anesthesia Plan: General   Post-op Pain Management:    Induction: Intravenous  PONV Risk Score and Plan: 4 or greater and Ondansetron, Dexamethasone and Midazolam  Airway Management Planned: Oral ETT and LMA  Additional Equipment:   Intra-op Plan:   Post-operative Plan: Extubation in OR  Informed Consent: I have reviewed the patients History and Physical, chart, labs and discussed the procedure including the risks, benefits and  alternatives for the proposed anesthesia with the patient or authorized representative who has indicated his/her understanding and acceptance.     Dental advisory given  Plan Discussed with: CRNA and Surgeon  Anesthesia Plan Comments:        Anesthesia Quick Evaluation

## 2021-01-19 NOTE — Anesthesia Procedure Notes (Signed)
Procedure Name: LMA Insertion Date/Time: 01/19/2021 1:27 PM Performed by: Jonna Munro, CRNA Pre-anesthesia Checklist: Patient identified, Emergency Drugs available, Suction available, Patient being monitored and Timeout performed Patient Re-evaluated:Patient Re-evaluated prior to induction Oxygen Delivery Method: Circle system utilized Preoxygenation: Pre-oxygenation with 100% oxygen Induction Type: IV induction LMA: LMA inserted LMA Size: 4.0 Number of attempts: 1 Placement Confirmation: positive ETCO2 and breath sounds checked- equal and bilateral Tube secured with: Tape Dental Injury: Teeth and Oropharynx as per pre-operative assessment

## 2021-01-19 NOTE — Interval H&P Note (Signed)
History and Physical Interval Note:  01/19/2021 1:15 PM  Charlene Kennedy  has presented today for surgery, with the diagnosis of right ureteral calculi.  The various methods of treatment have been discussed with the patient and family. After consideration of risks, benefits and other options for treatment, the patient has consented to  Procedure(s) with comments: CYSTOSCOPY WITH RETROGRADE PYELOGRAM, URETEROSCOPY AND STENT PLACEMENT (Right) - pt knows to arrive at 10:45 HOLMIUM LASER APPLICATION (Right) as a surgical intervention.  The patient's history has been reviewed, patient examined, no change in status, stable for surgery.  I have reviewed the patient's chart and labs.  Questions were answered to the patient's satisfaction.     Nicolette Bang

## 2021-01-19 NOTE — Anesthesia Postprocedure Evaluation (Signed)
Anesthesia Post Note  Patient: Brendi Mccarroll  Procedure(s) Performed: CYSTOSCOPY WITH RETROGRADE PYELOGRAM, URETEROSCOPY AND STENT PLACEMENT (Right ) HOLMIUM LASER APPLICATION (Right )  Patient location during evaluation: PACU Anesthesia Type: General Level of consciousness: awake, oriented, awake and alert and patient cooperative Pain management: satisfactory to patient Vital Signs Assessment: post-procedure vital signs reviewed and stable Respiratory status: spontaneous breathing, respiratory function stable and nonlabored ventilation Cardiovascular status: stable Postop Assessment: no apparent nausea or vomiting Anesthetic complications: no   No complications documented.   Last Vitals:  Vitals:   01/19/21 1114  BP: 139/73  Pulse: 64  Resp: 18  Temp: 36.9 C  SpO2: 99%    Last Pain:  Vitals:   01/19/21 1114  TempSrc: Oral  PainSc: 0-No pain                 Tanja Gift

## 2021-01-19 NOTE — Transfer of Care (Signed)
Immediate Anesthesia Transfer of Care Note  Patient: Charlene Kennedy  Procedure(s) Performed: CYSTOSCOPY WITH RETROGRADE PYELOGRAM, URETEROSCOPY AND STENT PLACEMENT (Right ) HOLMIUM LASER APPLICATION (Right )  Patient Location: PACU  Anesthesia Type:General  Level of Consciousness: awake, alert , oriented and patient cooperative  Airway & Oxygen Therapy: Patient Spontanous Breathing and Patient connected to nasal cannula oxygen  Post-op Assessment: Report given to RN, Post -op Vital signs reviewed and stable and Patient moving all extremities X 4  Post vital signs: Reviewed and stable  Last Vitals:  Vitals Value Taken Time  BP    Temp    Pulse 82 01/19/21 1438  Resp 7 01/19/21 1438  SpO2 89 % 01/19/21 1438  Vitals shown include unvalidated device data.  Last Pain:  Vitals:   01/19/21 1114  TempSrc: Oral  PainSc: 0-No pain      Patients Stated Pain Goal: 5 (98/92/11 9417)  Complications: No complications documented.

## 2021-01-19 NOTE — Op Note (Signed)
Preoperative diagnosis: Right ureteral stone  Postoperative diagnosis: Same  Procedure: 1 cystoscopy 2 right retrograde pyelography 3.  Intraoperative fluoroscopy, under one hour, with interpretation 4.  Right ureteroscopic stone manipulation with laser lithotripsy 5.  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 1.5cm distal ureteral calculus.  Indications: Patient is a 65 year old female with a history of ureteral stone.  After discussing treatment options, she decided proceed with right ureteroscopic stone manipulation.  Procedure in detail: The patient was brought to the operating room and a brief timeout was done to ensure correct patient, correct procedure, correct site.  General anesthesia was administered patient was placed in dorsal lithotomy position.  Her genitalia was then prepped and draped in usual sterile fashion.  A rigid 96 French cystoscope was passed in the urethra and the bladder.  Bladder was inspected free masses or lesions.  the right ureteral orifices were in the normal orthotopic locations.  a 6 french ureteral catheter was then instilled into the right ureter orifice.  a gentle retrograde was obtained and findings noted above.  we then placed a zip wire through the ureteral catheter and advanced up to the renal pelvis.  we then removed the cystoscope and cannulated the right ureteral orifice with a semirigid ureteroscope.  we then encountered the stone in the distal ureter.  using a 242 nm laser fiber and fragmented the stone into smaller pieces.  the pieces were then removed with a Ngage basket. once all stone fragments were removed we then placed a 6 x 24 double-j ureteral stent over the original zip wire.  We then removed the  wire and good coil was noted in the the renal pelvis under fluoroscopy and the bladder under direct vision.   The stone fragments were then removed from the bladder and sent for analysis.   the bladder was then drained and this concluded the procedure which was well tolerated by patient.  Complications: None  Condition: Stable, extubated, transferred to PACU  Plan: Patient is to be discharged home as to follow-up in 2 weeks for stent removal.

## 2021-01-20 ENCOUNTER — Other Ambulatory Visit (HOSPITAL_COMMUNITY)
Admission: RE | Admit: 2021-01-20 | Discharge: 2021-01-20 | Disposition: A | Discharge status: Home / Self Care | Payer: PRIVATE HEALTH INSURANCE | Source: Other Acute Inpatient Hospital | Attending: Urology | Admitting: Urology

## 2021-01-20 ENCOUNTER — Encounter (HOSPITAL_COMMUNITY): Payer: Self-pay | Admitting: Urology

## 2021-01-20 DIAGNOSIS — N201 Calculus of ureter: Secondary | ICD-10-CM | POA: Insufficient documentation

## 2021-01-20 NOTE — Discharge Instructions (Signed)
Ureteral Stent Implantation, Care After This sheet gives you information about how to care for yourself after your procedure. Your health care provider may also give you more specific instructions. If you have problems or questions, contact your health care provider. What can I expect after the procedure? After the procedure, it is common to have:  Nausea.  Mild pain when you urinate. You may feel this pain in your lower back or lower abdomen. The pain should stop within a few minutes after you urinate. This may last for up to 1 week.  A small amount of blood in your urine for several days. Follow these instructions at home: Medicines  Take over-the-counter and prescription medicines only as told by your health care provider.  If you were prescribed an antibiotic medicine, take it as told by your health care provider. Do not stop taking the antibiotic even if you start to feel better.  Do not drive for 24 hours if you were given a sedative during your procedure.  Ask your health care provider if the medicine prescribed to you requires you to avoid driving or using heavy machinery. Activity  Rest as told by your health care provider.  Avoid sitting for a long time without moving. Get up to take short walks every 1-2 hours. This is important to improve blood flow and breathing. Ask for help if you feel weak or unsteady.  Return to your normal activities as told by your health care provider. Ask your health care provider what activities are safe for you. General instructions  Watch for any blood in your urine. Call your health care provider if the amount of blood in your urine increases.  If you have a catheter: ? Follow instructions from your health care provider about taking care of your catheter and collection bag. ? Do not take baths, swim, or use a hot tub until your health care provider approves. Ask your health care provider if you may take showers. You may only be allowed to  take sponge baths.  Drink enough fluid to keep your urine pale yellow.  Do not use any products that contain nicotine or tobacco, such as cigarettes, e-cigarettes, and chewing tobacco. These can delay healing after surgery. If you need help quitting, ask your health care provider.  Keep all follow-up visits as told by your health care provider. This is important.   Contact a health care provider if:  You have pain that gets worse or does not get better with medicine, especially pain when you urinate.  You have difficulty urinating.  You feel nauseous or you vomit repeatedly during a period of more than 2 days after the procedure. Get help right away if:  Your urine is dark red or has blood clots in it.  You are leaking urine (have incontinence).  The end of the stent comes out of your urethra.  You cannot urinate.  You have sudden, sharp, or severe pain in your abdomen or lower back.  You have a fever.  You have swelling or pain in your legs.  You have difficulty breathing. Summary  After the procedure, it is common to have mild pain when you urinate that goes away within a few minutes after you urinate. This may last for up to 1 week.  Watch for any blood in your urine. Call your health care provider if the amount of blood in your urine increases.  Take over-the-counter and prescription medicines only as told by your health care provider.  Drink enough fluid to keep your urine pale yellow. This information is not intended to replace advice given to you by your health care provider. Make sure you discuss any questions you have with your health care provider. Document Revised: 08/12/2018 Document Reviewed: 08/13/2018 Elsevier Patient Education  2021 [place] Anesthesia, Adult, Care After This sheet gives you information about how to care for yourself after your procedure. Your health care provider may also give you more specific instructions. If you have  problems or questions, contact your health care provider. What can I expect after the procedure? After the procedure, the following side effects are common:  Pain or discomfort at the IV site.  Nausea.  Vomiting.  Sore throat.  Trouble concentrating.  Feeling cold or chills.  Feeling weak or tired.  Sleepiness and fatigue.  Soreness and body aches. These side effects can affect parts of the body that were not involved in surgery. Follow these instructions at home: For the time period you were told by your health care provider:  Rest.  Do not participate in activities where you could fall or become injured.  Do not drive or use machinery.  Do not drink alcohol.  Do not take sleeping pills or medicines that cause drowsiness.  Do not make important decisions or sign legal documents.  Do not take care of children on your own.   Eating and drinking  Follow any instructions from your health care provider about eating or drinking restrictions.  When you feel hungry, start by eating small amounts of foods that are soft and easy to digest (bland), such as toast. Gradually return to your regular diet.  Drink enough fluid to keep your urine pale yellow.  If you vomit, rehydrate by drinking water, juice, or clear broth. General instructions  If you have sleep apnea, surgery and certain medicines can increase your risk for breathing problems. Follow instructions from your health care provider about wearing your sleep device: ? Anytime you are sleeping, including during daytime naps. ? While taking prescription pain medicines, sleeping medicines, or medicines that make you drowsy.  Have a responsible adult stay with you for the time you are told. It is important to have someone help care for you until you are awake and alert.  Return to your normal activities as told by your health care provider. Ask your health care provider what activities are safe for you.  Take  over-the-counter and prescription medicines only as told by your health care provider.  If you smoke, do not smoke without supervision.  Keep all follow-up visits as told by your health care provider. This is important. Contact a health care provider if:  You have nausea or vomiting that does not get better with medicine.  You cannot eat or drink without vomiting.  You have pain that does not get better with medicine.  You are unable to pass urine.  You develop a skin rash.  You have a fever.  You have redness around your IV site that gets worse. Get help right away if:  You have difficulty breathing.  You have chest pain.  You have blood in your urine or stool, or you vomit blood. Summary  After the procedure, it is common to have a sore throat or nausea. It is also common to feel tired.  Have a responsible adult stay with you for the time you are told. It is important to have someone help care for you until you are  awake and alert.  When you feel hungry, start by eating small amounts of foods that are soft and easy to digest (bland), such as toast. Gradually return to your regular diet.  Drink enough fluid to keep your urine pale yellow.  Return to your normal activities as told by your health care provider. Ask your health care provider what activities are safe for you. This information is not intended to replace advice given to you by your health care provider. Make sure you discuss any questions you have with your health care provider. Document Revised: 07/21/2020 Document Reviewed: 02/18/2020 Elsevier Patient Education  2021 [place].   Acetaminophen; Oxycodone tablets What is this medicine? ACETAMINOPHEN; OXYCODONE (a set a MEE noe fen; ox i KOE done) is a pain reliever. It is used to treat moderate to severe pain. This medicine may be used for other purposes; ask your health care provider or pharmacist if you have questions. COMMON BRAND NAME(S): Endocet,  Magnacet, Nalocet, Narvox, Percocet, Perloxx, Primalev, Primlev, Prolate, Roxicet, Xolox What should I tell my health care provider before I take this medicine? They need to know if you have any of these conditions:  brain tumor  drug abuse or addiction  head injury  heart disease  if you often drink alcohol   kidney disease   liver disease  low adrenal gland function  lung disease, asthma, or breathing problem  seizures  stomach or intestine problems  taken an MAOI like Marplan, Nardil, or Parnate in the last 14 days  an unusual or allergic reaction to acetaminophen, oxycodone, other medicines, foods, dyes, or preservative  pregnant or trying to get pregnant  breast-feeding How should I use this medicine? Take this medicine by mouth with a full glass of water. Take it as directed on the label. You can take it with or without food. If it upsets your stomach, take it with food. Do not use it more often than directed. There may be unused or extra doses in the bottle after you finish your treatment. Talk to your health care provider if you have questions about your dose. A special MedGuide will be given to you by the pharmacist with each prescription and refill. Be sure to read this information carefully each time. Talk to your health care provider about the use of this medicine in children. Special care may be needed. Patients over 37 years of age may have a stronger reaction and need a smaller dose. Overdosage: If you think you have taken too much of this medicine contact a poison control center or emergency room at once. NOTE: This medicine is only for you. Do not share this medicine with others. What if I miss a dose? This does not apply. This medicine is not for regular use. It should only be used as needed. What may interact with this medicine? This medicine may interact with the following medications:  alcohol  antihistamines for allergy, cough and  cold  antiviral medicines for HIV or AIDS  atropine  certain antibiotics like clarithromycin, erythromycin, linezolid, rifampin  certain medicines for anxiety or sleep  certain medicines for bladder problems like oxybutynin, tolterodine  certain medicines for depression like amitriptyline, fluoxetine, sertraline  certain medicines for fungal infections like ketoconazole, itraconazole, voriconazole  certain medicines for migraine headache like almotriptan, eletriptan, frovatriptan, naratriptan, rizatriptan, sumatriptan, zolmitriptan  certain medicines for nausea or vomiting like dolasetron, ondansetron, palonosetron  certain medicines for Parkinson's disease like benztropine, trihexyphenidyl  certain medicines for seizures like phenobarbital, phenytoin,  primidone  certain medicines for stomach problems like dicyclomine, hyoscyamine  certain medicines for travel sickness like scopolamine  diuretics  general anesthetics like halothane, isoflurane, methoxyflurane, propofol  ipratropium  local anesthetics like lidocaine, pramoxine, tetracaine  MAOIs like Carbex, Eldepryl, Marplan, Nardil, and Parnate  medicines that relax muscles for surgery  methylene blue  nilotinib  other medicines with acetaminophen  other narcotic medicines for pain or cough  phenothiazines like chlorpromazine, mesoridazine, prochlorperazine, thioridazine This list may not describe all possible interactions. Give your health care provider a list of all the medicines, herbs, non-prescription drugs, or dietary supplements you use. Also tell them if you smoke, drink alcohol, or use illegal drugs. Some items may interact with your medicine. What should I watch for while using this medicine? Tell your health care provider if your pain does not go away, if it gets worse, or if you have new or a different type of pain. You may develop tolerance to this drug. Tolerance means that you will need a higher dose  of the drug for pain relief. Tolerance is normal and is expected if you take this drug for a long time. There are different types of narcotic drugs (opioids) for pain. If you take more than one type at the same time, you may have more side effects. Give your health care provider a list of all drugs you use. He or she will tell you how much drug to take. Do not take more drug than directed. Get emergency help right away if you have problems breathing. Do not suddenly stop taking your drug because you may develop a severe reaction. Your body becomes used to the drug. This does NOT mean you are addicted. Addiction is a behavior related to getting and using a drug for a nonmedical reason. If you have pain, you have a medical reason to take pain drug. Your health care provider will tell you how much drug to take. If your health care provider wants you to stop the drug, the dose will be slowly lowered over time to avoid any side effects. Talk to your health care provider about naloxone and how to get it. Naloxone is an emergency drug used for an opioid overdose. An overdose can happen if you take too much opioid. It can also happen if an opioid is taken with some other drugs or substances, like alcohol. Know the symptoms of an overdose, like trouble breathing, unusually tired or sleepy, or not being able to respond or wake up. Make sure to tell caregivers and close contacts where it is stored. Make sure they know how to use it. After naloxone is given, you must get emergency help right away. Naloxone is a temporary treatment. Repeat doses may be needed. Do not take other drugs that contain acetaminophen with this drug. Many non-prescription drugs contain acetaminophen. Always read labels carefully. If you have questions, ask your health care provider. If you take too much acetaminophen, get medical help right away. Too much acetaminophen can be very dangerous and cause liver damage. Even if you do not have symptoms,  it is important to get help right away. This drug does not prevent a heart attack or stroke. This drug may increase the chance of a heart attack or stroke. The chance may increase the longer you use this drug or if you have heart disease. If you take aspirin to prevent a heart attack or stroke, talk to your health care provider about using this drug. You may get drowsy  or dizzy. Do not drive, use machinery, or do anything that needs mental alertness until you know how this drug affects you. Do not stand up or sit up quickly, especially if you are an older patient. This reduces the risk of dizzy or fainting spells. Alcohol may interfere with the effect of this drug. Avoid alcoholic drinks. This drug will cause constipation. If you do not have a bowel movement for 3 days, call your health care provider. Your mouth may get dry. Chewing sugarless gum or sucking hard candy and drinking plenty of water may help. Contact your health care provider if the problem does not go away or is severe. What side effects may I notice from receiving this medicine? Side effects that you should report to your doctor or health care professional as soon as possible:  allergic reactions (skin rash, itching or hives; swelling of the face, lips, or tongue)  confusion  kidney injury (trouble passing urine or change in the amount of urine)  light-colored stool  liver injury (dark yellow or brown urine; general ill feeling or flu-like symptoms; loss of appetite, right upper belly pain; unusually weak or tired, yellowing of the eyes or skin)  low adrenal gland function (nausea; vomiting; loss of appetite; unusually weak or tired; dizziness; low blood pressure)  low blood pressure (dizziness; feeling faint or lightheaded, falls; unusually weak or tired)  redness, blistering, peeling, or loosening of the skin, including inside the mouth  serotonin syndrome (irritable; confusion; diarrhea; fast or irregular heartbeat; muscle  twitching; stiff muscles; trouble walking; sweating; high fever; seizures; chills; vomiting)  trouble breathing Side effects that usually do not require medical attention (report to your doctor or health care professional if they continue or are bothersome):  constipation  dry mouth  nausea, vomiting  tiredness This list may not describe all possible side effects. Call your doctor for medical advice about side effects. You may report side effects to FDA at 1-800-FDA-1088. Where should I keep my medicine? Keep out of the reach of children and pets. This medicine can be abused. Keep it in a safe place to protect it from theft. Do not share it with anyone. It is only for you. Selling or giving away this medicine is dangerous and against the law. Store at room temperature between 20 and 25 degrees C (68 and 77 degrees F). Protect from light. Get rid of any unused medicine after the expiration date. This medicine may cause harm and death if it is taken by other adults, children, or pets. It is important to get rid of the medicine as soon as you no longer need it or it is expired. You can do this in two ways:  Take the medicine to a medicine take-back program. Check with your pharmacy or law enforcement to find a location.  If you cannot return the medicine, flush it down the toilet. NOTE: This sheet is a summary. It may not cover all possible information. If you have questions about this medicine, talk to your doctor, pharmacist, or health care provider.  2021 Elsevier/Gold Standard (2020-08-03 11:12:15)   Tamsulosin capsules What is this medicine? TAMSULOSIN (tam SOO loe sin) is an alpha blocker. It is used to treat the signs and symptoms of an enlarged prostate in men. This condition is also called benign prostatic hyperplasia (BPH). This medicine may be used for other purposes; ask your health care provider or pharmacist if you have questions. COMMON BRAND NAME(S): Flomax What should I  tell my health  care provider before I take this medicine? They need to know if you have any of the following conditions:  advanced kidney disease  advanced liver disease  low blood pressure  prostate cancer  an unusual or allergic reaction to tamsulosin, sulfa drugs, other medicines, foods, dyes, or preservatives  pregnant or trying to get pregnant  breast-feeding How should I use this medicine? Take this medicine by mouth about 30 minutes after the same meal every day. Follow the directions on the prescription label. Swallow the capsules whole with a glass of water. Do not crush, chew, or open capsules. Do not take your medicine more often than directed. Do not stop taking your medicine unless your doctor tells you to. Talk to your pediatrician regarding the use of this medicine in children. Special care may be needed. Overdosage: If you think you have taken too much of this medicine contact a poison control center or emergency room at once. NOTE: This medicine is only for you. Do not share this medicine with others. What if I miss a dose? If you miss a dose, take it as soon as you can. If it is almost time for your next dose, take only that dose. Do not take double or extra doses. If you stop taking your medicine for several days or more, ask your doctor or health care professional what dose you should start back on. What may interact with this medicine?  cimetidine  fluoxetine  ketoconazole  medicines for erectile disfunction like sildenafil, tadalafil, vardenafil  medicines for high blood pressure  other alpha-blockers like alfuzosin, doxazosin, phentolamine, phenoxybenzamine, prazosin, terazosin  warfarin This list may not describe all possible interactions. Give your health care provider a list of all the medicines, herbs, non-prescription drugs, or dietary supplements you use. Also tell them if you smoke, drink alcohol, or use illegal drugs. Some items may interact with  your medicine. What should I watch for while using this medicine? Visit your doctor or health care professional for regular check ups. You will need lab work done before you start this medicine and regularly while you are taking it. Check your blood pressure as directed. Ask your health care professional what your blood pressure should be, and when you should contact him or her. This medicine may make you feel dizzy or lightheaded. This is more likely to happen after the first dose, after an increase in dose, or during hot weather or exercise. Drinking alcohol and taking some medicines can make this worse. Do not drive, use machinery, or do anything that needs mental alertness until you know how this medicine affects you. Do not sit or stand up quickly. If you begin to feel dizzy, sit down until you feel better. These effects can decrease once your body adjusts to the medicine. Contact your doctor or health care professional right away if you have an erection that lasts longer than 4 hours or if it becomes painful. This may be a sign of a serious problem and must be treated right away to prevent permanent damage. If you are thinking of having cataract surgery, tell your eye surgeon that you have taken this medicine. What side effects may I notice from receiving this medicine? Side effects that you should report to your doctor or health care professional as soon as possible:  allergic reactions like skin rash or itching, hives, swelling of the lips, mouth, tongue, or throat  breathing problems  change in vision  feeling faint or lightheaded  irregular heartbeat  prolonged or painful erection  weakness Side effects that usually do not require medical attention (report to your doctor or health care professional if they continue or are bothersome):  back pain  change in sex drive or performance  constipation, nausea or vomiting  cough  drowsy  runny or stuffy nose  trouble  sleeping This list may not describe all possible side effects. Call your doctor for medical advice about side effects. You may report side effects to FDA at 1-800-FDA-1088. Where should I keep my medicine? Keep out of the reach of children. Store at room temperature between 15 and 30 degrees C (59 and 86 degrees F). Throw away any unused medicine after the expiration date. NOTE: This sheet is a summary. It may not cover all possible information. If you have questions about this medicine, talk to your doctor, pharmacist, or health care provider.  2021 Elsevier/Gold Standard (2018-04-10 12:54:06)   Ondansetron oral dissolving tablet What is this medicine? ONDANSETRON (on DAN se tron) is used to treat nausea and vomiting caused by chemotherapy. It is also used to prevent or treat nausea and vomiting after surgery. This medicine may be used for other purposes; ask your health care provider or pharmacist if you have questions. COMMON BRAND NAME(S): Zofran ODT What should I tell my health care provider before I take this medicine? They need to know if you have any of these conditions:  heart disease  history of irregular heartbeat  liver disease  low levels of magnesium or potassium in the blood  an unusual or allergic reaction to ondansetron, granisetron, other medicines, foods, dyes, or preservatives  pregnant or trying to get pregnant  breast-feeding How should I use this medicine? These tablets are made to dissolve in the mouth. Do not try to push the tablet through the foil backing. With dry hands, peel away the foil backing and gently remove the tablet. Place the tablet in the mouth and allow it to dissolve, then swallow. While you may take these tablets with water, it is not necessary to do so. Talk to your pediatrician regarding the use of this medicine in children. Special care may be needed. Overdosage: If you think you have taken too much of this medicine contact a poison  control center or emergency room at once. NOTE: This medicine is only for you. Do not share this medicine with others. What if I miss a dose? If you miss a dose, take it as soon as you can. If it is almost time for your next dose, take only that dose. Do not take double or extra doses. What may interact with this medicine? Do not take this medicine with any of the following medications:  apomorphine  certain medicines for fungal infections like fluconazole, itraconazole, ketoconazole, posaconazole, voriconazole  cisapride  dronedarone  pimozide  thioridazine This medicine may also interact with the following medications:  carbamazepine  certain medicines for depression, anxiety, or psychotic disturbances  fentanyl  linezolid  MAOIs like Carbex, Eldepryl, Marplan, Nardil, and Parnate  methylene blue (injected into a vein)  other medicines that prolong the QT interval (cause an abnormal heart rhythm) like dofetilide, ziprasidone  phenytoin  rifampicin  tramadol This list may not describe all possible interactions. Give your health care provider a list of all the medicines, herbs, non-prescription drugs, or dietary supplements you use. Also tell them if you smoke, drink alcohol, or use illegal drugs. Some items may interact with your medicine. What should I watch for while using  this medicine? Check with your doctor or health care professional as soon as you can if you have any sign of an allergic reaction. What side effects may I notice from receiving this medicine? Side effects that you should report to your doctor or health care professional as soon as possible:  allergic reactions like skin rash, itching or hives, swelling of the face, lips, or tongue  breathing problems  confusion  dizziness  fast or irregular heartbeat  feeling faint or lightheaded, falls  fever and chills  loss of balance or coordination  seizures  sweating  swelling of the hands  and feet  tightness in the chest  tremors  unusually weak or tired Side effects that usually do not require medical attention (report to your doctor or health care professional if they continue or are bothersome):  constipation or diarrhea  headache This list may not describe all possible side effects. Call your doctor for medical advice about side effects. You may report side effects to FDA at 1-800-FDA-1088. Where should I keep my medicine? Keep out of the reach of children. Store between 2 and 30 degrees C (36 and 86 degrees F). Throw away any unused medicine after the expiration date. NOTE: This sheet is a summary. It may not cover all possible information. If you have questions about this medicine, talk to your doctor, pharmacist, or health care provider.  2021 Elsevier/Gold Standard (2018-10-28 07:14:10)

## 2021-01-23 ENCOUNTER — Ambulatory Visit: Payer: PRIVATE HEALTH INSURANCE | Admitting: Urology

## 2021-01-25 ENCOUNTER — Ambulatory Visit (INDEPENDENT_AMBULATORY_CARE_PROVIDER_SITE_OTHER): Payer: PRIVATE HEALTH INSURANCE | Admitting: Urology

## 2021-01-25 ENCOUNTER — Encounter: Payer: Self-pay | Admitting: Urology

## 2021-01-25 ENCOUNTER — Other Ambulatory Visit: Payer: Self-pay

## 2021-01-25 DIAGNOSIS — N1330 Unspecified hydronephrosis: Secondary | ICD-10-CM

## 2021-01-25 DIAGNOSIS — N200 Calculus of kidney: Secondary | ICD-10-CM

## 2021-01-25 LAB — CALCULI, WITH PHOTOGRAPH (CLINICAL LAB)
Calcium Oxalate Monohydrate: 80 %
Hydroxyapatite: 20 %
Weight Calculi: 172 mg

## 2021-01-25 LAB — URINALYSIS, ROUTINE W REFLEX MICROSCOPIC
Bilirubin, UA: NEGATIVE
Glucose, UA: NEGATIVE
Ketones, UA: NEGATIVE
Nitrite, UA: NEGATIVE
Specific Gravity, UA: 1.015 (ref 1.005–1.030)
Urobilinogen, Ur: 0.2 mg/dL (ref 0.2–1.0)
pH, UA: 7 (ref 5.0–7.5)

## 2021-01-25 LAB — MICROSCOPIC EXAMINATION
RBC, Urine: >30 /hpf — AB (ref 0–2)
Renal Epithel, UA: NONE SEEN /hpf

## 2021-01-25 MED ORDER — CIPROFLOXACIN HCL 500 MG PO TABS
500.0000 mg | ORAL_TABLET | Freq: Once | ORAL | Status: AC
Start: 2021-01-25 — End: 2021-01-25
  Administered 2021-01-25: 500 mg via ORAL

## 2021-01-25 NOTE — Patient Instructions (Signed)

## 2021-01-25 NOTE — Progress Notes (Signed)
   01/25/21  CC: followup right ureteroscopic stone extraction  HPI: Ms Hineman is a 65yo here for right ureteral stent removal There were no vitals taken for this visit. NED. A&Ox3.   No respiratory distress   Abd soft, NT, ND Normal external genitalia with patent urethral meatus  Cystoscopy Procedure Note  Patient identification was confirmed, informed consent was obtained, and patient was prepped using Betadine solution.  Lidocaine jelly was administered per urethral meatus.    Procedure: - Flexible cystoscope introduced, without any difficulty.   - Thorough search of the bladder revealed:    normal urethral meatus    normal urothelium    no stones    no ulcers     no tumors    no urethral polyps    no trabeculation  - Ureteral orifices were normal in position and appearance.  USING A GRASPER THE RIGHT URETERAL STENT WAS REMOVED INTACT  Post-Procedure: - Patient tolerated the procedure well  Assessment/ Plan: Metabolic evaluation for recurrent nephrolithiasis. RTC 1 month with renal US   No follow-ups on file.  Nicolette Bang, MD

## 2021-01-25 NOTE — Progress Notes (Signed)

## 2021-01-26 ENCOUNTER — Telehealth: Payer: Self-pay

## 2021-01-26 LAB — BASIC METABOLIC PANEL
BUN/Creatinine Ratio: 14 (ref 12–28)
BUN: 18 mg/dL (ref 8–27)
CO2: 24 mmol/L (ref 20–29)
Calcium: 10.9 mg/dL — ABNORMAL HIGH (ref 8.7–10.3)
Chloride: 101 mmol/L (ref 96–106)
Creatinine, Ser: 1.26 mg/dL — ABNORMAL HIGH (ref 0.57–1.00)
Glucose: 83 mg/dL (ref 65–99)
Potassium: 3.6 mmol/L (ref 3.5–5.2)
Sodium: 140 mmol/L (ref 134–144)
eGFR: 48 mL/min/{1.73_m2} — ABNORMAL LOW (ref >59)

## 2021-01-26 LAB — PTH, INTACT AND CALCIUM
Calcium: 10.9 mg/dL — ABNORMAL HIGH (ref 8.7–10.3)
PTH: 236 pg/mL — ABNORMAL HIGH (ref 15–65)

## 2021-01-26 LAB — URIC ACID: Uric Acid: 8.4 mg/dL — ABNORMAL HIGH (ref 3.0–7.2)

## 2021-01-26 NOTE — Telephone Encounter (Signed)
Patient called and made aware.

## 2021-01-26 NOTE — Progress Notes (Signed)
Patient called to request earlier release date to work of 01/23/21 instead of 01/25/21. Per Dr. Alyson Ingles patient can return if patient wishes.

## 2021-01-26 NOTE — Telephone Encounter (Signed)
-----   Message from Cleon Gustin, MD sent at 01/26/2021  8:44 AM EST ----- Her PTH is very high. I made a referral for Dr. Harlow Asa at [facility] surgery

## 2021-02-06 ENCOUNTER — Other Ambulatory Visit (HOSPITAL_COMMUNITY): Payer: Self-pay | Admitting: Surgery

## 2021-02-06 ENCOUNTER — Other Ambulatory Visit: Payer: Self-pay | Admitting: Surgery

## 2021-02-06 DIAGNOSIS — E210 Primary hyperparathyroidism: Secondary | ICD-10-CM

## 2021-02-09 ENCOUNTER — Other Ambulatory Visit (HOSPITAL_COMMUNITY): Payer: Self-pay | Admitting: Surgery

## 2021-02-09 DIAGNOSIS — E210 Primary hyperparathyroidism: Secondary | ICD-10-CM

## 2021-02-24 ENCOUNTER — Encounter (HOSPITAL_COMMUNITY): Payer: Self-pay

## 2021-02-24 ENCOUNTER — Ambulatory Visit (HOSPITAL_COMMUNITY)
Admission: RE | Admit: 2021-02-24 | Discharge: 2021-02-24 | Disposition: A | Discharge status: Home / Self Care | Payer: PRIVATE HEALTH INSURANCE | Source: Ambulatory Visit | Attending: Surgery | Admitting: Surgery

## 2021-02-24 ENCOUNTER — Ambulatory Visit (HOSPITAL_COMMUNITY)
Admission: RE | Admit: 2021-02-24 | Discharge: 2021-02-24 | Disposition: A | Discharge status: Home / Self Care | Payer: PRIVATE HEALTH INSURANCE | Source: Ambulatory Visit | Attending: Urology | Admitting: Urology

## 2021-02-24 ENCOUNTER — Other Ambulatory Visit: Payer: Self-pay

## 2021-02-24 ENCOUNTER — Ambulatory Visit (HOSPITAL_COMMUNITY): Payer: PRIVATE HEALTH INSURANCE

## 2021-02-24 DIAGNOSIS — N1330 Unspecified hydronephrosis: Secondary | ICD-10-CM | POA: Diagnosis present

## 2021-02-24 DIAGNOSIS — E210 Primary hyperparathyroidism: Secondary | ICD-10-CM

## 2021-02-24 HISTORY — DX: Disorder of kidney and ureter, unspecified: N28.9

## 2021-02-24 MED ORDER — TECHNETIUM TC 99M SESTAMIBI - CARDIOLITE
25.0000 | Freq: Once | INTRAVENOUS | Status: AC | PRN
  Administered 2021-02-24: 12:00:00 25 via INTRAVENOUS

## 2021-03-03 ENCOUNTER — Ambulatory Visit (HOSPITAL_COMMUNITY): Payer: PRIVATE HEALTH INSURANCE

## 2021-03-10 ENCOUNTER — Ambulatory Visit (INDEPENDENT_AMBULATORY_CARE_PROVIDER_SITE_OTHER): Payer: PRIVATE HEALTH INSURANCE | Admitting: Urology

## 2021-03-10 ENCOUNTER — Other Ambulatory Visit: Payer: Self-pay

## 2021-03-10 VITALS — BP 146/73 | HR 76 | Temp 98.6°F | Ht 63.0 in | Wt 175.0 lb

## 2021-03-10 DIAGNOSIS — N1330 Unspecified hydronephrosis: Secondary | ICD-10-CM

## 2021-03-10 LAB — MICROSCOPIC EXAMINATION
Epithelial Cells (non renal): >10 /hpf — AB (ref 0–10)
RBC, Urine: NONE SEEN /hpf (ref 0–2)
Renal Epithel, UA: NONE SEEN /hpf

## 2021-03-10 LAB — URINALYSIS, ROUTINE W REFLEX MICROSCOPIC
Bilirubin, UA: NEGATIVE
Glucose, UA: NEGATIVE
Ketones, UA: NEGATIVE
Nitrite, UA: NEGATIVE
Protein,UA: NEGATIVE
RBC, UA: NEGATIVE
Specific Gravity, UA: 1.01 (ref 1.005–1.030)
Urobilinogen, Ur: 0.2 mg/dL (ref 0.2–1.0)
pH, UA: 7 (ref 5.0–7.5)

## 2021-03-10 NOTE — Progress Notes (Signed)
03/10/2021 2:42 PM   Junius Finner [birth year] [id number]  Referring provider: Marylee Floras, [address] [place] [address]  followup nephrolithiasis  HPI: Ms Massie Maroon is a 64yo here for followup for nephrolithiasis. No stone events since last visit. She has mild intermittent sharp right flank pain. She underwent renal US 02/24/2021 which showed moderate right hydronephrosis. No other complaints today.   PMH: Past Medical History:  Diagnosis Date  . High cholesterol   . Hypertension   . Hyperthyroidism   . Kidney disease   . Renal insufficiency     Surgical History: Past Surgical History:  Procedure Laterality Date  . ABDOMINAL HYSTERECTOMY    . CYSTOSCOPY WITH RETROGRADE PYELOGRAM, URETEROSCOPY AND STENT PLACEMENT Right 01/19/2021   Procedure: CYSTOSCOPY WITH RETROGRADE PYELOGRAM, URETEROSCOPY AND STENT PLACEMENT;  Surgeon: Cleon Gustin, MD;  Location: [facility];  Service: Urology;  Laterality: Right;  pt knows to arrive at 10:45  . HOLMIUM LASER APPLICATION Right 06/23/6313   Procedure: HOLMIUM LASER APPLICATION;  Surgeon: Cleon Gustin, MD;  Location: [facility];  Service: Urology;  Laterality: Right;    Home Medications:  Allergies as of 03/10/2021      Reactions   Ace Inhibitors Cough      Medication List       Accurate as of March 10, 2021  2:42 PM. If you have any questions, ask your nurse or doctor.        amLODipine 10 MG tablet Commonly known as: NORVASC Take 10 mg by mouth daily.   aspirin 81 MG EC tablet Take 81 mg by mouth daily.   Biotin 1000 MCG tablet Take 1,000 mcg by mouth daily.   cholecalciferol 25 MCG (1000 UNIT) tablet Commonly known as: VITAMIN D3 Take 1,000 Units by mouth daily.   cloNIDine 0.1 MG tablet Commonly known as: CATAPRES Take 0.1 mg by mouth 2 (two) times daily.   fluticasone 50 MCG/ACT nasal spray Commonly known as: FLONASE Place 1 spray into both nostrils daily as needed for allergies.    olmesartan 40 MG tablet Commonly known as: BENICAR Take 40 mg by mouth daily.   PROBIOTIC DAILY PO Take 1 Scoop by mouth daily. With Prebiotic       Allergies:  Allergies  Allergen Reactions  . Ace Inhibitors Cough    Family History: No family history on file.  Social History:  reports that she has never smoked. She has never used smokeless tobacco. She reports current alcohol use. She reports that she does not use drugs.  ROS: All other review of systems were reviewed and are negative except what is noted above in HPI  Physical Exam: BP (!) 146/73   Pulse 76   Temp 98.6 F (37 C)   Ht 5\' 3"  (1.6 m)   Wt 175 lb (79.4 kg)   BMI 31.00 kg/m   Constitutional:  Alert and oriented, No acute distress. HEENT: [place] AT, moist mucus membranes.  Trachea midline, no masses. Cardiovascular: No clubbing, cyanosis, or edema. Respiratory: Normal respiratory effort, no increased work of breathing. GI: Abdomen is soft, nontender, nondistended, no abdominal masses GU: No CVA tenderness.  Lymph: No cervical or inguinal lymphadenopathy. Skin: No rashes, bruises or suspicious lesions. Neurologic: Grossly intact, no focal deficits, moving all 4 extremities. Psychiatric: Normal mood and affect.  Laboratory Data: No results found for: WBC, HGB, HCT, MCV, PLT  Lab Results  Component Value Date   CREATININE 1.26 (H) 01/25/2021    No  results found for: PSA  No results found for: TESTOSTERONE  No results found for: HGBA1C  Urinalysis    Component Value Date/Time   APPEARANCEUR Clear 01/25/2021 1633   GLUCOSEU Negative 01/25/2021 1633   BILIRUBINUR Negative 01/25/2021 1633   PROTEINUR 2+ (A) 01/25/2021 1633   NITRITE Negative 01/25/2021 1633   LEUKOCYTESUR 1+ (A) 01/25/2021 1633    Lab Results  Component Value Date   LABMICR See below: 01/25/2021   WBCUA 6-10 (A) 01/25/2021   LABEPIT 0-10 01/25/2021   MUCUS Present 01/25/2021   BACTERIA Moderate (A) 01/25/2021     Pertinent Imaging: Renal US 02/24/2021: Images reviewed and discussed with the patient Results for orders placed during the hospital encounter of 12/01/20  DG Abd 1 View  Narrative CLINICAL DATA:  Ureteral stone.  Abdominal pain.  EXAM: ABDOMEN - 1 VIEW  COMPARISON:  No prior.  FINDINGS: Soft tissue structures are unremarkable. Stool noted throughout the colon. No bowel distention or free air. Multiple bilateral renal stones and or nephrocalcinosis. Multiple prominent pelvic calcifications are noted. Although these may represent phleboliths, distal ureteral stones cannot be excluded. Calcifications in the right lower pelvis are particularly prominent with the largest measuring 1.2 cm. The possibility of a calcified adnexal process including dermoid cannot be excluded. Pelvic ultrasound can be obtained for further evaluation as needed. Small sclerotic density noted over the left pelvis, most likely benign bone island. Thoracolumbar spine scoliosis. Degenerative change lumbar spine and both hips.  IMPRESSION: 1. Multiple bilateral renal stones and or nephrocalcinosis. 2. Multiple prominent pelvic calcifications are noted. Although these may represent phleboliths, distal ureteral stones cannot be excluded. Calcifications in the right lower pelvis are particularly prominent with the largest measuring 1.2 cm. The possibility of a calcified adnexal process including dermoid cannot be excluded. Pelvic ultrasound can be obtained for further evaluation.   Electronically Signed By: Marcello Moores  Register On: 12/01/2020 10:23  No results found for this or any previous visit.  No results found for this or any previous visit.  No results found for this or any previous visit.  Results for orders placed during the hospital encounter of 02/24/21  Ultrasound renal complete  Narrative CLINICAL DATA:  Nephrolithiasis.  EXAM: RENAL / URINARY TRACT ULTRASOUND COMPLETE  COMPARISON:   CT scan December 23, 2020  FINDINGS: Right Kidney:  Renal measurements: 11.8 x 6.0 x 6.7 cm = volume: 248.2 mL. Moderate hydronephrosis. Cortical thinning to 5 mm. Numerous stones. The largest measured stone is 8.7 mm.  Left Kidney:  Renal measurements: 11.8 x 6.0 x 4.7 cm = volume: 172.7 mL. Increased cortical echogenicity. The indeterminate mass described on the comparison CT scan is not visualized on this study.  Bladder:  The postvoid residual is 82.6 cc. The bladder is otherwise unremarkable.  Other:  None.  IMPRESSION: 1. The indeterminate mass seen in the left kidney on the previous CT scan is not visualized on this study. Per the December 23, 2020 CT scan report, recommend MRI for further evaluation. 2. Continued moderate hydronephrosis.  Continued right renal stones. 3. Increased cortical echogenicity on the left and cortical thinning on the right is nonspecific but often seen with medical renal disease. 4. Moderate postvoid residual.  These results will be called to the ordering clinician or representative by the Radiologist Assistant, and communication documented in the PACS or [employer].   Electronically Signed By: Dorise Bullion III M.D On: 02/26/2021 16:56  No results found for this or any previous visit.  No  results found for this or any previous visit.  Results for orders placed in visit on 12/23/20  CT RENAL STONE STUDY  Narrative CLINICAL DATA:  RIGHT hydronephrosis, BILATERAL renal calculi by prior abdominal radiograph, mild RIGHT flank pain, had RIGHT hydronephrosis by ultrasound in December 2021  EXAM: CT ABDOMEN AND PELVIS WITHOUT CONTRAST  TECHNIQUE: Multidetector CT imaging of the abdomen and pelvis was performed following the standard protocol without IV contrast.  COMPARISON:  Abdominal radiograph 12/01/2020  FINDINGS: Lower chest: Minimal atelectasis at base of lingula. 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  Hepatobiliary: 11 x 9 mm lesion within lateral segment LEFT lobe liver consistent with tiny cyst. Gallbladder and liver otherwise normal appearance  Pancreas: Normal appearance  Spleen: Normal appearance  Adrenals/Urinary Tract: Thickening of adrenal glands without discrete mass. Numerous BILATERAL renal calculi. Scattered BILATERAL renal cysts. Indeterminate intermediate attenuation lesion at medial aspect of upper LEFT kidney, 1.9 x 1.9 cm image 20, question hyperdense cyst versus solid mass. Moderate RIGHT hydronephrosis and hydroureter. Three large adjacent calculi are seen within the distal RIGHT ureter, measuring 10 mm, 12 mm, and 8 mm. Ureter beyond the calculi remains mildly dilated to the ureterovesical junction without additional calculus or mass identified. No LEFT hydronephrosis or hydroureter. Bladder otherwise unremarkable.  Stomach/Bowel: Normal appendix. Stomach and bowel loops normal appearance  Vascular/Lymphatic: Aorta normal caliber. Minimal atherosclerotic calcifications aorta and iliac arteries without aneurysm. Scattered pelvic phleboliths.  Reproductive: Uterus surgically absent.  Normal sized ovaries.  Other: No free air or free fluid. No hernia or inflammatory process.  Musculoskeletal: Unremarkable  IMPRESSION: Moderate RIGHT hydronephrosis and hydroureter with 3 large adjacent calculi within the distal RIGHT ureter, measuring 10 mm, 12 mm, and 8 mm in size.  RIGHT ureter beyond the calculi remains dilated to the ureterovesical junction without additional calculus or obvious mass.  Numerous BILATERAL renal calculi and BILATERAL renal cysts.  Indeterminate 1.9 x 1.9 cm intermediate attenuation lesion at medial aspect of upper LEFT kidney question hyperdense cyst versus solid mass; follow-up renal ultrasound recommended to characterize; if ultrasound is unable to adequately characterize, than MR assessment would be  warranted.  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  Aortic Atherosclerosis (ICD10-I70.0).   Electronically Signed By: Lavonia Dana M.D. On: 12/23/2020 13:28   Assessment & Plan:    1. Hydronephrosis, unspecified hydronephrosis type -STAT CT stone study due to hydronephrosis and hx of nephrolithiasis - Urinalysis, Routine w reflex microscopic   No follow-ups on file.  Nicolette Bang, MD  [facility] [place]

## 2021-03-10 NOTE — Progress Notes (Signed)

## 2021-03-13 ENCOUNTER — Ambulatory Visit (HOSPITAL_COMMUNITY): Payer: PRIVATE HEALTH INSURANCE

## 2021-03-13 ENCOUNTER — Ambulatory Visit: Payer: Self-pay | Admitting: Surgery

## 2021-03-17 ENCOUNTER — Encounter: Payer: Self-pay | Admitting: Urology

## 2021-03-17 NOTE — Patient Instructions (Signed)

## 2021-04-01 ENCOUNTER — Encounter: Payer: Self-pay | Admitting: Surgery

## 2021-04-01 DIAGNOSIS — E210 Primary hyperparathyroidism: Secondary | ICD-10-CM | POA: Diagnosis present

## 2021-04-01 NOTE — H&P (Signed)
General Surgery [facility]  Junius Finner DOB: 06/04/1956 Married / Language: English / Race: Black or African American Female   History of Present Illness  The patient is a 65 year old female who presents with primary hyperparathyroidism.  CHIEF COMPLAINT: primary hyperparathyroidism  Patient is referred by Dr. Nicolette Bang for surgical evaluation and management of suspected primary hyperparathyroidism. Patient had been recently diagnosed with nephrolithiasis. Evaluation included laboratory studies showing an elevated serum calcium level of 10.9 and an elevated intact PTH level of 236. Patient notes other symptoms including chronic fatigue and bone and joint discomfort. She has not had any imaging studies. Patient has had no prior head or neck surgery. There is no family history of parathyroid disease. She presents today accompanied by her mother-in-law.   Past Surgical History  Colon Polyp Removal - Colonoscopy  Oral Surgery   Diagnostic Studies History Colonoscopy  1-5 years ago Mammogram  within last year  Allergies  Ibuprofen *ANALGESICS - ANTI-INFLAMMATORY*  Alpha1-Proteinase Inhibitor *RESPIRATORY AGENTS - MISC.*  Allergies Reconciled   Medication History  amLODIPine Besylate (10MG  Tablet, Oral) Active. Amoxicillin (500MG  Capsule, Oral) Active. Cinacalcet HCl (30MG  Tablet, Oral) Active. cloNIDine HCl (0.1MG  Tablet, Oral) Active. [employer] Purelax (17GM/SCOOP Powder, Oral) Active. Olmesartan Medoxomil (40MG  Tablet, Oral) Active. Medications Reconciled  Social History  Alcohol use  Occasional alcohol use. Caffeine use  Coffee, Tea. No drug use  Tobacco use  Never smoker.  Family History  Family history unknown  First Degree Relatives  Migraine Headache  Son. Thyroid problems  Daughter, Mother.  Pregnancy / Birth History  Age at menarche  72 years. Age of menopause  50-50 Gravida  3 Irregular periods   Maternal age  63-20 Para  2  Other Problems High blood pressure  Home Oxygen Use  Hypercholesterolemia  Kidney Stone   Review of Systems  General Not Present- Appetite Loss, Chills, Fatigue, Fever, Night Sweats, Weight Gain and Weight Loss. Skin Present- Dryness. Not Present- Change in Wart/Mole, Hives, Jaundice, New Lesions, Non-Healing Wounds, Rash and Ulcer. HEENT Present- Wears glasses/contact lenses. Not Present- Earache, Hearing Loss, Hoarseness, Nose Bleed, Oral Ulcers, Ringing in the Ears, Seasonal Allergies, Sinus Pain, Sore Throat, Visual Disturbances and Yellow Eyes. Respiratory Not Present- Bloody sputum, Chronic Cough, Difficulty Breathing, Snoring and Wheezing. Cardiovascular Not Present- Chest Pain, Difficulty Breathing Lying Down, Leg Cramps, Palpitations, Rapid Heart Rate, Shortness of Breath and Swelling of Extremities. Gastrointestinal Not Present- Abdominal Pain, Bloating, Bloody Stool, Change in Bowel Habits, Chronic diarrhea, Constipation, Difficulty Swallowing, Excessive gas, Gets full quickly at meals, Hemorrhoids, Indigestion, Nausea, Rectal Pain and Vomiting. Female Genitourinary Not Present- Frequency, Nocturia, Painful Urination, Pelvic Pain and Urgency. Musculoskeletal Not Present- Back Pain, Joint Pain, Joint Stiffness, Muscle Pain, Muscle Weakness and Swelling of Extremities. Neurological Not Present- Decreased Memory, Fainting, Headaches, Numbness, Seizures, Tingling, Tremor, Trouble walking and Weakness. Psychiatric Not Present- Anxiety, Bipolar, Change in Sleep Pattern, Depression, Fearful and Frequent crying. Endocrine Not Present- Cold Intolerance, Excessive Hunger, Hair Changes, Heat Intolerance, Hot flashes and New Diabetes. Hematology Not Present- Blood Thinners, Easy Bruising, Excessive bleeding, Gland problems, HIV and Persistent Infections.  Vitals  Weight: 179.13 lb Height: 63in Body Surface Area: 1.85 m Body Mass Index: 31.73 kg/m   Temp.: 98.94F  Pulse: 84 (Regular)  P.OX: 97% (Room air) BP: 130/80(Sitting, Left Arm, Standard)  Physical Exam   GENERAL APPEARANCE Development: normal Nutritional status: normal Gross deformities: none  SKIN Rash, lesions, ulcers: none Induration, erythema: none Nodules: none palpable  EYES Conjunctiva and lids: normal Pupils: equal and reactive Iris: normal bilaterally  EARS, NOSE, MOUTH, THROAT External ears: no lesion or deformity External nose: no lesion or deformity Hearing: grossly normal Due to Covid-19 pandemic, patient is wearing a mask.  NECK Symmetric: yes Trachea: midline Thyroid: no palpable nodules in the thyroid bed  CHEST Respiratory effort: normal Retraction or accessory muscle use: no Breath sounds: normal bilaterally Rales, rhonchi, wheeze: none  CARDIOVASCULAR Auscultation: regular rhythm, normal rate Murmurs: none Pulses: radial pulse 2+ palpable Lower extremity edema: none  MUSCULOSKELETAL Station and gait: normal Digits and nails: no clubbing or cyanosis Muscle strength: grossly normal all extremities Range of motion: grossly normal all extremities Deformity: none  LYMPHATIC Cervical: none palpable Supraclavicular: none palpable  PSYCHIATRIC Oriented to person, place, and time: yes Mood and affect: normal for situation Judgment and insight: appropriate for situation    Assessment & Plan   PRIMARY HYPERPARATHYROIDISM (E21.0)  Follow Up - Call [facility] office after tests / studies doneto discuss further plans  Patient is referred by her urologist for surgical evaluation and management of suspected primary hyperparathyroidism.  Patient provided with a copy of "Parathyroid Surgery: Treatment for Your Parathyroid Gland Problem", published by [employer], 12 pages. Book reviewed and explained to patient during visit today.  Patient has a history of nephrolithiasis and chronic fatigue. Laboratory studies indicate possible  primary hyperparathyroidism. We will continue her evaluation with additional laboratory studies to include a 24-hour urine collection for calcium and a 25-hydroxy vitamin D level. We will schedule her for imaging studies to include an ultrasound examination of the neck as well as a nuclear medicine parathyroid scan. Once the studies are completed, we will review the results and communicate them to the patient. If they indicate the presence of a parathyroid adenoma, then I believe she will be a good candidate for minimally invasive outpatient surgery. We discussed the procedure today. I provided her with written literature on parathyroid surgery to review at home. If the studies failed to identify an adenoma, then we will consider proceeding with a 4D CT scan of the neck in hopes of identifying the adenoma preoperatively.  Patient understands the above rationale. We will schedule her for the above studies and then be in touch once the results are available.  ADDENDUM  Telephone call to patient with results.  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  Will plan left inferior minimally invasive parathyroidectomy as an out-patient procedure.  The risks and benefits of the procedure have been discussed at length with the patient. The patient understands the proposed procedure, potential alternative treatments, and the course of recovery to be expected. All of the patient's questions have been answered at this time. The patient wishes to proceed with surgery.  Armandina Gemma, [facility] Office: [phone]

## 2021-04-10 NOTE — Patient Instructions (Signed)
DUE TO COVID-19 ONLY ONE VISITOR IS ALLOWED TO COME WITH YOU AND STAY IN THE WAITING ROOM ONLY DURING PRE OP AND PROCEDURE DAY OF SURGERY. THE 1 VISITOR  MAY VISIT WITH YOU AFTER SURGERY IN YOUR PRIVATE ROOM DURING VISITING HOURS ONLY!  YOU NEED TO HAVE A COVID 19 TEST ON: 04/11/21, THIS TEST MUST BE DONE BEFORE SURGERY,  COVID TESTING SITE [address] [place] [address], IT IS ON THE RIGHT GOING OUT [address] APPROXIMATELY  2 MINUTES PAST [employer] ON THE RIGHT. ONCE YOUR COVID TEST IS COMPLETED,  PLEASE BEGIN THE QUARANTINE INSTRUCTIONS AS OUTLINED IN YOUR HANDOUT.                Charlene Kennedy   Your procedure is scheduled on: 04/14/21   Report to [facility] Main  Entrance   Report to admitting at: 7:00 AM     Call this number if you have problems the morning of surgery [phone]    Remember: Do not eat solid food :After Midnight. Clear liquids until: 6:00 am.  CLEAR LIQUID DIET  Foods Allowed                                                                     Foods Excluded  Coffee and tea, regular and decaf                             liquids that you cannot  Plain Jell-O any favor except red or purple                                           see through such as: Fruit ices (not with fruit pulp)                                     milk, soups, orange juice  Iced Popsicles                                    All solid food Carbonated beverages, regular and diet                                    Cranberry, grape and apple juices Sports drinks like Gatorade Lightly seasoned clear broth or consume(fat free) Sugar, honey syrup  Sample Menu Breakfast                                Lunch                                     Supper Cranberry juice                    Beef broth  Chicken broth Jell-O                                     Grape juice                           Apple juice Coffee or tea                         Jell-O                                      Popsicle                                                Coffee or tea                        Coffee or tea  _____________________________________________________________________  BRUSH YOUR TEETH MORNING OF SURGERY AND RINSE YOUR MOUTH OUT, NO CHEWING GUM CANDY OR MINTS.    Take these medicines the morning of surgery with A SIP OF WATER: amlodipine.Take cetirizine as needed.Use flonase as usual.                            You may not have any metal on your body including hair pins and              piercings  Do not wear jewelry, make-up, lotions, powders or perfumes, deodorant             Do not wear nail polish on your fingernails.  Do not shave  48 hours prior to surgery.    Do not bring valuables to the hospital. [place].  Contacts, dentures or bridgework may not be worn into surgery.  Leave suitcase in the car. After surgery it may be brought to your room.     Patients discharged the day of surgery will not be allowed to drive home. IF YOU ARE HAVING SURGERY AND GOING HOME THE SAME DAY, YOU MUST HAVE AN ADULT TO DRIVE YOU HOME AND BE WITH YOU FOR 24 HOURS. YOU MAY GO HOME BY TAXI OR UBER OR ORTHERWISE, BUT AN ADULT MUST ACCOMPANY YOU HOME AND STAY WITH YOU FOR 24 HOURS.  Name and phone number of your driver:  Special Instructions: N/A              Please read over the following fact sheets you were given: _____________________________________________________________________        [facility] - Preparing for Surgery Before surgery, you can play an important role.  Because skin is not sterile, your skin needs to be as free of germs as possible.  You can reduce the number of germs on your skin by washing with CHG (chlorahexidine gluconate) soap before surgery.  CHG is an antiseptic cleaner which kills germs and bonds with the skin to continue killing germs even after washing. Please DO NOT use  if you have an allergy to CHG  or antibacterial soaps.  If your skin becomes reddened/irritated stop using the CHG and inform your nurse when you arrive at Short Stay. Do not shave (including legs and underarms) for at least 48 hours prior to the first CHG shower.  You may shave your face/neck. Please follow these instructions carefully:  1.  Shower with CHG Soap the night before surgery and the  morning of Surgery.  2.  If you choose to wash your hair, wash your hair first as usual with your  normal  shampoo.  3.  After you shampoo, rinse your hair and body thoroughly to remove the  shampoo.                           4.  Use CHG as you would any other liquid soap.  You can apply chg directly  to the skin and wash                       Gently with a scrungie or clean washcloth.  5.  Apply the CHG Soap to your body ONLY FROM THE NECK DOWN.   Do not use on face/ open                           Wound or open sores. Avoid contact with eyes, ears mouth and genitals (private parts).                       Wash face,  Genitals (private parts) with your normal soap.             6.  Wash thoroughly, paying special attention to the area where your surgery  will be performed.  7.  Thoroughly rinse your body with warm water from the neck down.  8.  DO NOT shower/wash with your normal soap after using and rinsing off  the CHG Soap.                9.  Pat yourself dry with a clean towel.            10.  Wear clean pajamas.            11.  Place clean sheets on your bed the night of your first shower and do not  sleep with pets. Day of Surgery : Do not apply any lotions/deodorants the morning of surgery.  Please wear clean clothes to the hospital/surgery center.  FAILURE TO FOLLOW THESE INSTRUCTIONS MAY RESULT IN THE CANCELLATION OF YOUR SURGERY PATIENT SIGNATURE_________________________________  NURSE  SIGNATURE__________________________________  ________________________________________________________________________

## 2021-04-11 ENCOUNTER — Other Ambulatory Visit (HOSPITAL_COMMUNITY)
Admission: RE | Admit: 2021-04-11 | Discharge: 2021-04-11 | Disposition: A | Discharge status: Home / Self Care | Payer: PRIVATE HEALTH INSURANCE | Source: Ambulatory Visit | Attending: Surgery | Admitting: Surgery

## 2021-04-11 ENCOUNTER — Other Ambulatory Visit: Payer: Self-pay

## 2021-04-11 ENCOUNTER — Encounter (HOSPITAL_COMMUNITY)
Admission: RE | Admit: 2021-04-11 | Discharge: 2021-04-11 | Disposition: A | Discharge status: Home / Self Care | Payer: PRIVATE HEALTH INSURANCE | Source: Ambulatory Visit | Attending: Surgery | Admitting: Surgery

## 2021-04-11 ENCOUNTER — Encounter (HOSPITAL_COMMUNITY): Payer: Self-pay

## 2021-04-11 DIAGNOSIS — Z20822 Contact with and (suspected) exposure to covid-19: Secondary | ICD-10-CM | POA: Insufficient documentation

## 2021-04-11 DIAGNOSIS — Z01812 Encounter for preprocedural laboratory examination: Secondary | ICD-10-CM | POA: Insufficient documentation

## 2021-04-11 HISTORY — DX: Anemia, unspecified: D64.9

## 2021-04-11 HISTORY — DX: Pneumonia, unspecified organism: J18.9

## 2021-04-11 LAB — BASIC METABOLIC PANEL
Anion gap: 7 (ref 5–15)
BUN: 18 mg/dL (ref 8–23)
CO2: 28 mmol/L (ref 22–32)
Calcium: 10.7 mg/dL — ABNORMAL HIGH (ref 8.9–10.3)
Chloride: 107 mmol/L (ref 98–111)
Creatinine, Ser: 1.33 mg/dL — ABNORMAL HIGH (ref 0.44–1.00)
GFR, Estimated: 45 mL/min — ABNORMAL LOW (ref >60)
Glucose, Bld: 94 mg/dL (ref 70–99)
Potassium: 3.5 mmol/L (ref 3.5–5.1)
Sodium: 142 mmol/L (ref 135–145)

## 2021-04-11 LAB — CBC
HCT: 46.3 % — ABNORMAL HIGH (ref 36.0–46.0)
Hemoglobin: 14.7 g/dL (ref 12.0–15.0)
MCH: 28.1 pg (ref 26.0–34.0)
MCHC: 31.7 g/dL (ref 30.0–36.0)
MCV: 88.5 fL (ref 80.0–100.0)
Platelets: 302 10*3/uL (ref 150–400)
RBC: 5.23 MIL/uL — ABNORMAL HIGH (ref 3.87–5.11)
RDW: 14.7 % (ref 11.5–15.5)
WBC: 5.4 10*3/uL (ref 4.0–10.5)
nRBC: 0 % (ref 0.0–0.2)

## 2021-04-11 LAB — SARS CORONAVIRUS 2 (TAT 6-24 HRS): SARS Coronavirus 2: NEGATIVE

## 2021-04-11 LAB — NO BLOOD PRODUCTS

## 2021-04-11 NOTE — Progress Notes (Signed)
COVID Vaccine Completed: Yes Date COVID Vaccine completed: 12/2020 COVID vaccine manufacturer: [employer]      PCP - Eulis Manly: FNP Cardiologist - No  Chest x-ray -  EKG - 01/17/21 Stress Test -  ECHO -  Cardiac Cath -  Pacemaker/ICD device last checked:  Sleep Study -  CPAP -   Fasting Blood Sugar -  Checks Blood Sugar _____ times a day  Blood Thinner Instructions: Aspirin Instructions: Last Dose:  Anesthesia review:   Patient denies shortness of breath, fever, cough and chest pain at PAT appointment   Patient verbalized understanding of instructions that were given to them at the PAT appointment. Patient was also instructed that they will need to review over the PAT instructions again at home before surgery.

## 2021-04-11 NOTE — Progress Notes (Signed)
Pt. Refused blood products. 

## 2021-04-14 ENCOUNTER — Ambulatory Visit (HOSPITAL_COMMUNITY)
Admission: RE | Admit: 2021-04-14 | Discharge: 2021-04-14 | Disposition: A | Discharge status: Home / Self Care | Payer: PRIVATE HEALTH INSURANCE | Source: Ambulatory Visit | Attending: Surgery | Admitting: Surgery

## 2021-04-14 ENCOUNTER — Ambulatory Visit (HOSPITAL_COMMUNITY): Payer: PRIVATE HEALTH INSURANCE | Admitting: Anesthesiology

## 2021-04-14 ENCOUNTER — Encounter (HOSPITAL_COMMUNITY)
Admission: RE | Disposition: A | Discharge status: Home / Self Care | Payer: Self-pay | Source: Ambulatory Visit | Attending: Surgery

## 2021-04-14 ENCOUNTER — Encounter (HOSPITAL_COMMUNITY): Payer: Self-pay | Admitting: Surgery

## 2021-04-14 DIAGNOSIS — D351 Benign neoplasm of parathyroid gland: Secondary | ICD-10-CM | POA: Diagnosis not present

## 2021-04-14 DIAGNOSIS — E210 Primary hyperparathyroidism: Secondary | ICD-10-CM | POA: Diagnosis present

## 2021-04-14 DIAGNOSIS — Z87442 Personal history of urinary calculi: Secondary | ICD-10-CM | POA: Insufficient documentation

## 2021-04-14 DIAGNOSIS — Z888 Allergy status to other drugs, medicaments and biological substances status: Secondary | ICD-10-CM | POA: Diagnosis not present

## 2021-04-14 DIAGNOSIS — Z86010 Personal history of colonic polyps: Secondary | ICD-10-CM | POA: Diagnosis not present

## 2021-04-14 DIAGNOSIS — Z886 Allergy status to analgesic agent status: Secondary | ICD-10-CM | POA: Insufficient documentation

## 2021-04-14 DIAGNOSIS — E7800 Pure hypercholesterolemia, unspecified: Secondary | ICD-10-CM | POA: Diagnosis not present

## 2021-04-14 DIAGNOSIS — Z8349 Family history of other endocrine, nutritional and metabolic diseases: Secondary | ICD-10-CM | POA: Diagnosis not present

## 2021-04-14 DIAGNOSIS — Z9981 Dependence on supplemental oxygen: Secondary | ICD-10-CM | POA: Insufficient documentation

## 2021-04-14 HISTORY — PX: PARATHYROIDECTOMY: SHX19

## 2021-04-14 SURGERY — PARATHYROIDECTOMY
Anesthesia: General | Site: Neck | Laterality: Left

## 2021-04-14 MED ORDER — ACETAMINOPHEN 500 MG PO TABS
ORAL_TABLET | ORAL | Status: AC
  Filled 2021-04-14: qty 2

## 2021-04-14 MED ORDER — FENTANYL CITRATE (PF) 250 MCG/5ML IJ SOLN
INTRAMUSCULAR | Status: DC | PRN
  Administered 2021-04-14: 100 ug via INTRAVENOUS
  Administered 2021-04-14: 50 ug via INTRAVENOUS
  Administered 2021-04-14: 100 ug via INTRAVENOUS

## 2021-04-14 MED ORDER — ORAL CARE MOUTH RINSE: 15.0000 mL | Freq: Once | OROMUCOSAL | Status: AC

## 2021-04-14 MED ORDER — DEXAMETHASONE SODIUM PHOSPHATE 10 MG/ML IJ SOLN
INTRAMUSCULAR | Status: DC | PRN
  Administered 2021-04-14: 5 mg via INTRAVENOUS

## 2021-04-14 MED ORDER — TRAMADOL HCL 50 MG PO TABS: 50.0000 mg | ORAL_TABLET | Freq: Four times a day (QID) | ORAL | 0 refills | Status: AC | PRN

## 2021-04-14 MED ORDER — DEXAMETHASONE SODIUM PHOSPHATE 10 MG/ML IJ SOLN
INTRAMUSCULAR | Status: AC
  Filled 2021-04-14: qty 1

## 2021-04-14 MED ORDER — HYDROMORPHONE HCL 1 MG/ML IJ SOLN
INTRAMUSCULAR | Status: AC
  Filled 2021-04-14: qty 1

## 2021-04-14 MED ORDER — PROPOFOL 10 MG/ML IV BOLUS
INTRAVENOUS | Status: DC | PRN
  Administered 2021-04-14: 130 mg via INTRAVENOUS

## 2021-04-14 MED ORDER — HYDROMORPHONE HCL 1 MG/ML IJ SOLN
0.2500 mg | INTRAMUSCULAR | Status: DC | PRN
  Administered 2021-04-14 (×2): 0.5 mg via INTRAVENOUS

## 2021-04-14 MED ORDER — MIDAZOLAM HCL 2 MG/2ML IJ SOLN
INTRAMUSCULAR | Status: AC
  Filled 2021-04-14: qty 2

## 2021-04-14 MED ORDER — LIDOCAINE 2% (20 MG/ML) 5 ML SYRINGE
INTRAMUSCULAR | Status: DC | PRN
  Administered 2021-04-14: 60 mg via INTRAVENOUS

## 2021-04-14 MED ORDER — CEFAZOLIN SODIUM-DEXTROSE 2-4 GM/100ML-% IV SOLN
2.0000 g | INTRAVENOUS | Status: AC
  Administered 2021-04-14: 2 g via INTRAVENOUS
  Filled 2021-04-14: qty 100

## 2021-04-14 MED ORDER — CHLORHEXIDINE GLUCONATE CLOTH 2 % EX PADS: 6.0000 | MEDICATED_PAD | Freq: Once | CUTANEOUS | Status: DC

## 2021-04-14 MED ORDER — SUGAMMADEX SODIUM 200 MG/2ML IV SOLN
INTRAVENOUS | Status: DC | PRN
  Administered 2021-04-14: 200 mg via INTRAVENOUS

## 2021-04-14 MED ORDER — BUPIVACAINE HCL (PF) 0.5 % IJ SOLN
INTRAMUSCULAR | Status: AC
  Filled 2021-04-14: qty 30

## 2021-04-14 MED ORDER — ROCURONIUM BROMIDE 10 MG/ML (PF) SYRINGE
PREFILLED_SYRINGE | INTRAVENOUS | Status: DC | PRN
  Administered 2021-04-14: 50 mg via INTRAVENOUS

## 2021-04-14 MED ORDER — ACETAMINOPHEN 500 MG PO TABS
1000.0000 mg | ORAL_TABLET | Freq: Four times a day (QID) | ORAL | Status: DC | PRN
  Administered 2021-04-14: 1000 mg via ORAL

## 2021-04-14 MED ORDER — LIDOCAINE 2% (20 MG/ML) 5 ML SYRINGE
INTRAMUSCULAR | Status: AC
  Filled 2021-04-14: qty 5

## 2021-04-14 MED ORDER — PROPOFOL 10 MG/ML IV BOLUS
INTRAVENOUS | Status: AC
  Filled 2021-04-14: qty 20

## 2021-04-14 MED ORDER — ONDANSETRON HCL 4 MG/2ML IJ SOLN
INTRAMUSCULAR | Status: DC | PRN
  Administered 2021-04-14: 4 mg via INTRAVENOUS

## 2021-04-14 MED ORDER — ONDANSETRON HCL 4 MG/2ML IJ SOLN
INTRAMUSCULAR | Status: AC
  Filled 2021-04-14: qty 2

## 2021-04-14 MED ORDER — 0.9 % SODIUM CHLORIDE (POUR BTL) OPTIME
TOPICAL | Status: DC | PRN
  Administered 2021-04-14: 1000 mL

## 2021-04-14 MED ORDER — FENTANYL CITRATE (PF) 250 MCG/5ML IJ SOLN
INTRAMUSCULAR | Status: AC
  Filled 2021-04-14: qty 5

## 2021-04-14 MED ORDER — MIDAZOLAM HCL 5 MG/5ML IJ SOLN
INTRAMUSCULAR | Status: DC | PRN
  Administered 2021-04-14: 2 mg via INTRAVENOUS

## 2021-04-14 MED ORDER — BUPIVACAINE-EPINEPHRINE 0.5% -1:200000 IJ SOLN
INTRAMUSCULAR | Status: DC | PRN
  Administered 2021-04-14: 10 mL

## 2021-04-14 MED ORDER — LACTATED RINGERS IV SOLN: INTRAVENOUS | Status: DC

## 2021-04-14 MED ORDER — CHLORHEXIDINE GLUCONATE 0.12 % MT SOLN
15.0000 mL | Freq: Once | OROMUCOSAL | Status: AC
  Administered 2021-04-14: 15 mL via OROMUCOSAL

## 2021-04-14 MED ORDER — ROCURONIUM BROMIDE 10 MG/ML (PF) SYRINGE
PREFILLED_SYRINGE | INTRAVENOUS | Status: AC
  Filled 2021-04-14: qty 10

## 2021-04-14 SURGICAL SUPPLY — 30 items
ATTRACTOMAT 16X20 MAGNETIC DRP (DRAPES) ×2 IMPLANT
BLADE SURG 15 STRL LF DISP TIS (BLADE) ×1 IMPLANT
BLADE SURG 15 STRL SS (BLADE) ×1
CHLORAPREP W/TINT 26 (MISCELLANEOUS) ×2 IMPLANT
CLIP VESOCCLUDE MED 6/CT (CLIP) ×4 IMPLANT
CLIP VESOCCLUDE SM WIDE 6/CT (CLIP) ×4 IMPLANT
COVER SURGICAL LIGHT HANDLE (MISCELLANEOUS) ×2 IMPLANT
COVER WAND RF STERILE (DRAPES) ×2 IMPLANT
DERMABOND ADVANCED (GAUZE/BANDAGES/DRESSINGS) ×1
DERMABOND ADVANCED .7 DNX12 (GAUZE/BANDAGES/DRESSINGS) ×1 IMPLANT
DRAPE LAPAROTOMY T 98X78 PEDS (DRAPES) ×2 IMPLANT
DRAPE UTILITY XL STRL (DRAPES) ×2 IMPLANT
ELECT REM PT RETURN 15FT ADLT (MISCELLANEOUS) ×2 IMPLANT
GAUZE 4X4 16PLY RFD (DISPOSABLE) ×2 IMPLANT
GLOVE SURG ORTHO LTX SZ8 (GLOVE) ×2 IMPLANT
GOWN STRL REUS W/TWL XL LVL3 (GOWN DISPOSABLE) ×6 IMPLANT
HEMOSTAT SURGICEL 2X4 FIBR (HEMOSTASIS) ×2 IMPLANT
ILLUMINATOR WAVEGUIDE N/F (MISCELLANEOUS) IMPLANT
KIT BASIN OR (CUSTOM PROCEDURE TRAY) ×2 IMPLANT
KIT TURNOVER KIT A (KITS) ×2 IMPLANT
NEEDLE HYPO 25X1 1.5 SAFETY (NEEDLE) ×2 IMPLANT
PACK BASIC VI WITH GOWN DISP (CUSTOM PROCEDURE TRAY) ×2 IMPLANT
PENCIL SMOKE EVACUATOR (MISCELLANEOUS) ×2 IMPLANT
SUT MNCRL AB 4-0 PS2 18 (SUTURE) ×2 IMPLANT
SUT VIC AB 3-0 SH 18 (SUTURE) ×2 IMPLANT
SYR BULB IRRIG 60ML STRL (SYRINGE) ×2 IMPLANT
SYR CONTROL 10ML LL (SYRINGE) ×2 IMPLANT
TOWEL OR 17X26 10 PK STRL BLUE (TOWEL DISPOSABLE) ×2 IMPLANT
TOWEL OR NON WOVEN STRL DISP B (DISPOSABLE) ×2 IMPLANT
TUBING CONNECTING 10 (TUBING) ×2 IMPLANT

## 2021-04-14 NOTE — Transfer of Care (Signed)
Immediate Anesthesia Transfer of Care Note  Patient: Charlene Kennedy  Procedure(s) Performed: LEFT INFERIOR PARATHYROIDECTOMY (Left Neck)  Patient Location: PACU  Anesthesia Type:General  Level of Consciousness: awake, oriented and patient cooperative  Airway & Oxygen Therapy: Patient Spontanous Breathing and Patient connected to face mask oxygen  Post-op Assessment: Report given to RN and Post -op Vital signs reviewed and stable  Post vital signs: Reviewed and stable  Last Vitals:  Vitals Value Taken Time  BP    Temp    Pulse 79 04/14/21 1016  Resp 16 04/14/21 1016  SpO2 100 % 04/14/21 1016  Vitals shown include unvalidated device data.  Last Pain:  Vitals:   04/14/21 0743  TempSrc: Oral         Complications: No complications documented.

## 2021-04-14 NOTE — Anesthesia Procedure Notes (Signed)
Procedure Name: Intubation Date/Time: 04/14/2021 9:20 AM Performed by: Lollie Sails, CRNA Pre-anesthesia Checklist: Patient identified, Emergency Drugs available, Suction available, Patient being monitored and Timeout performed Patient Re-evaluated:Patient Re-evaluated prior to induction Oxygen Delivery Method: Circle system utilized Preoxygenation: Pre-oxygenation with 100% oxygen Induction Type: IV induction Ventilation: Mask ventilation without difficulty Laryngoscope Size: Miller and 3 Grade View: Grade I Tube type: Oral Number of attempts: 1 Airway Equipment and Method: Stylet Placement Confirmation: ETT inserted through vocal cords under direct vision,  positive ETCO2 and breath sounds checked- equal and bilateral Secured at: 21 cm Tube secured with: Tape Dental Injury: Teeth and Oropharynx as per pre-operative assessment

## 2021-04-14 NOTE — Anesthesia Postprocedure Evaluation (Signed)
Anesthesia Post Note  Patient: Charlene Kennedy  Procedure(s) Performed: LEFT INFERIOR PARATHYROIDECTOMY (Left Neck)     Patient location during evaluation: PACU Anesthesia Type: General Level of consciousness: awake and alert Pain management: pain level controlled Vital Signs Assessment: post-procedure vital signs reviewed and stable Respiratory status: spontaneous breathing, nonlabored ventilation, respiratory function stable and patient connected to nasal cannula oxygen Cardiovascular status: blood pressure returned to baseline and stable Postop Assessment: no apparent nausea or vomiting Anesthetic complications: no   No complications documented.  Last Vitals:  Vitals:   04/14/21 1030 04/14/21 1045  BP: (!) 162/84 (!) 146/84  Pulse: 86 79  Resp: 12 17  Temp:    SpO2: 97% 96%    Last Pain:  Vitals:   04/14/21 1045  TempSrc:   PainSc: 4                  Malays[place] Crance,W. EDMOND

## 2021-04-14 NOTE — Op Note (Signed)
OPERATIVE REPORT - PARATHYROIDECTOMY  Preoperative diagnosis: Primary hyperparathyroidism  Postop diagnosis: Same  Procedure: Left inferior minimally invasive parathyroidectomy  Surgeon:  Armandina Gemma, MD  Anesthesia: General endotracheal  Estimated blood loss: Minimal  Preparation: ChloraPrep  Indications: Patient is referred by Dr. Nicolette Bang for surgical evaluation and management of suspected primary hyperparathyroidism. Patient had been recently diagnosed with nephrolithiasis. Evaluation included laboratory studies showing an elevated serum calcium level of 10.9 and an elevated intact PTH level of 236. 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  Plan left inferior minimally invasive parathyroidectomy as an out-patient procedure.  Procedure: The patient was prepared in the pre-operative holding area. The patient was brought to the operating room and placed in a supine position on the operating room table. Following administration of general anesthesia, the patient was positioned and then prepped and draped in the usual strict aseptic fashion. After ascertaining that an adequate level of anesthesia been achieved, a neck incision was made with a #15 blade. Dissection was carried through subcutaneous tissues and platysma. Hemostasis was obtained with the electrocautery. Skin flaps were developed circumferentially and a Weitlander retractor was placed for exposure.  Strap muscles were incised in the midline. Strap muscles were reflected laterally exposing the thyroid lobe. With gentle blunt dissection the thyroid lobe was mobilized.  Dissection was carried through adipose tissue and an enlarged parathyroid gland was identified immediately inferior to the left thyroid lobe. It was gently mobilized. Vascular structures were divided between small ligaclips. Care was taken to avoid the recurrent laryngeal nerve and the esophagus. The  parathyroid gland was completely excised. It was submitted to pathology where frozen section confirmed parathyroid tissue consistent with adenoma.  Neck was irrigated with warm saline and good hemostasis was noted. Fibrillar was placed in the operative field. Strap muscles were approximated in the midline with interrupted 3-0 Vicryl sutures. Platysma was closed with interrupted 3-0 Vicryl sutures. Marcaine was infiltrated circumferentially. Skin was closed with a running 4-0 Monocryl subcuticular suture. Wound was washed and dried and Dermabond was applied. Patient was awakened from anesthesia and brought to the recovery room. The patient tolerated the procedure well.   Armandina Gemma, MD [facility] Office: [phone]

## 2021-04-14 NOTE — Interval H&P Note (Signed)
History and Physical Interval Note:  04/14/2021 9:00 AM  Charlene Kennedy  has presented today for surgery, with the diagnosis of primary hyperparathyroidism.  The various methods of treatment have been discussed with the patient and family. After consideration of risks, benefits and other options for treatment, the patient has consented to    Procedure(s): LEFT INFERIOR PARATHYROIDECTOMY (Left) as a surgical intervention.    The patient's history has been reviewed, patient examined, no change in status, stable for surgery.  I have reviewed the patient's chart and labs.  Questions were answered to the patient's satisfaction.    Armandina Gemma, MD [facility] Office: [place]

## 2021-04-14 NOTE — Anesthesia Preprocedure Evaluation (Addendum)
Anesthesia Evaluation  Patient identified by MRN, date of birth, ID band Patient awake    Reviewed: Allergy & Precautions, H&P , NPO status , Patient's Chart, lab work & pertinent test results  Airway Mallampati: II  TM Distance: >3 FB Neck ROM: Full    Dental no notable dental hx. (+) Teeth Intact, Dental Advisory Given   Pulmonary neg pulmonary ROS,    Pulmonary exam normal breath sounds clear to auscultation       Cardiovascular hypertension, Pt. on medications  Rhythm:Regular Rate:Normal     Neuro/Psych negative neurological ROS  negative psych ROS   GI/Hepatic negative GI ROS, Neg liver ROS,   Endo/Other  Hyperthyroidism   Renal/GU negative Renal ROS  negative genitourinary   Musculoskeletal   Abdominal   Peds  Hematology negative hematology ROS (+) Blood dyscrasia, anemia ,   Anesthesia Other Findings   Reproductive/Obstetrics negative OB ROS                            Anesthesia Physical Anesthesia Plan  ASA: II  Anesthesia Plan: General   Post-op Pain Management:    Induction: Intravenous  PONV Risk Score and Plan: 4 or greater and Ondansetron, Dexamethasone and Midazolam  Airway Management Planned: Oral ETT  Additional Equipment:   Intra-op Plan:   Post-operative Plan: Extubation in OR  Informed Consent: I have reviewed the patients History and Physical, chart, labs and discussed the procedure including the risks, benefits and alternatives for the proposed anesthesia with the patient or authorized representative who has indicated his/her understanding and acceptance.     Dental advisory given  Plan Discussed with: CRNA  Anesthesia Plan Comments:         Anesthesia Quick Evaluation

## 2021-04-15 ENCOUNTER — Encounter (HOSPITAL_COMMUNITY): Payer: Self-pay | Admitting: Surgery

## 2021-04-18 LAB — SURGICAL PATHOLOGY

## 2021-05-18 ENCOUNTER — Other Ambulatory Visit: Payer: Self-pay

## 2021-10-31 ENCOUNTER — Other Ambulatory Visit: Payer: Self-pay

## 2022-09-10 IMAGING — CT CT RENAL STONE PROTOCOL
2 of 4 series · 15 of 46 positions shown, 17 images · non-contrast
Comparison: Abdominal radiograph 12/01/2020

CLINICAL DATA: RIGHT hydronephrosis, BILATERAL renal calculi by
prior abdominal radiograph, mild RIGHT flank pain, had RIGHT
hydronephrosis by ultrasound in October 2020

EXAM:
CT ABDOMEN AND PELVIS WITHOUT CONTRAST
TECHNIQUE: Multidetector CT imaging of the abdomen and pelvis was performed
following the standard protocol without IV contrast.

[Series 2: axial st · axial · 0.72mm/px · z∈[-414,-38]mm · 12 of 85 slices shown, 14 images]
[im 5/85  soft-tissue]
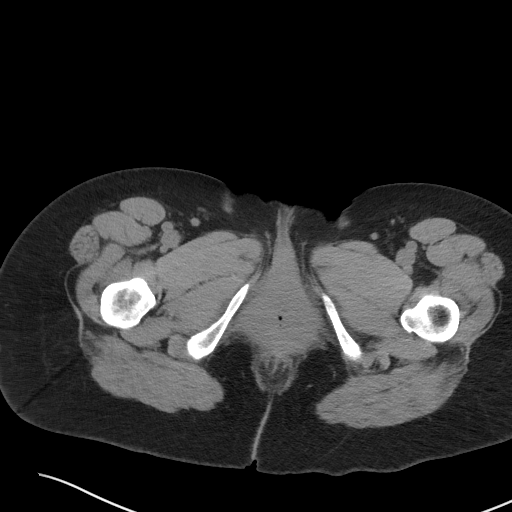
[im 5/85  bone]
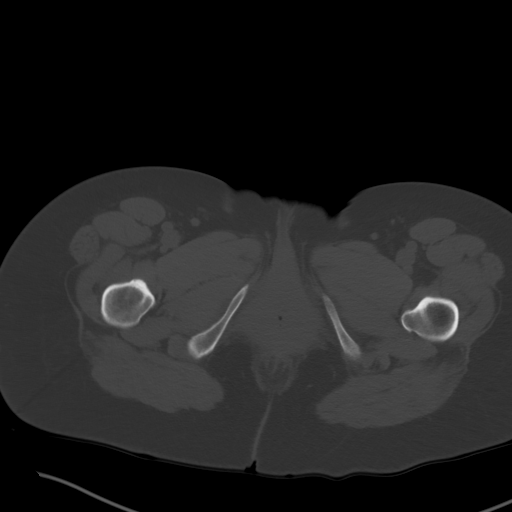
[im 15/85  soft-tissue]
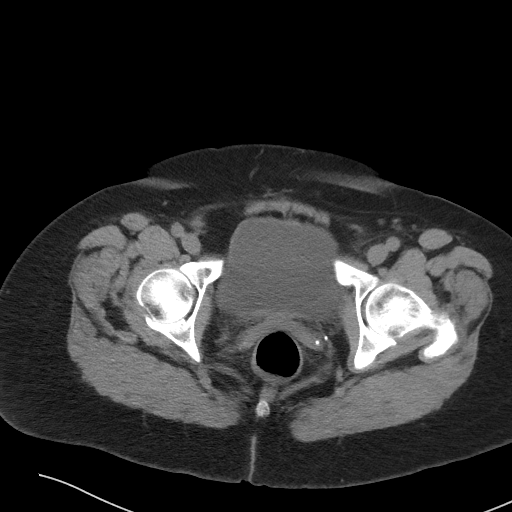
[im 19/85  soft-tissue]
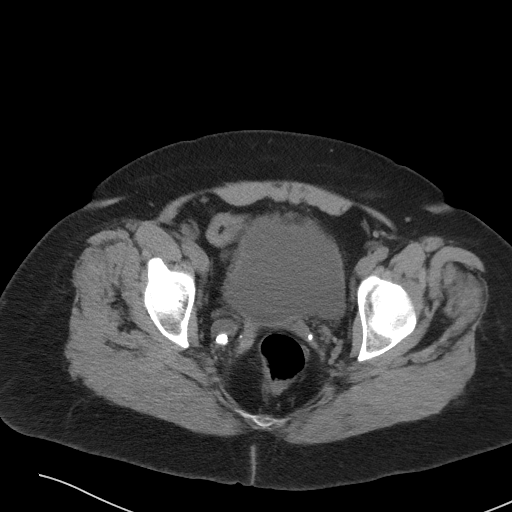
[im 24/85  soft-tissue]
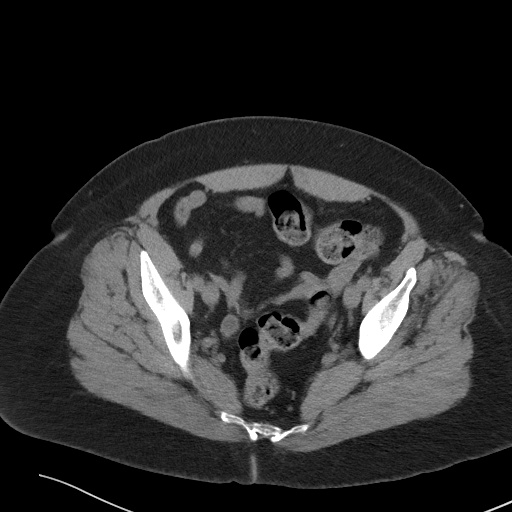
[im 33/85  soft-tissue]
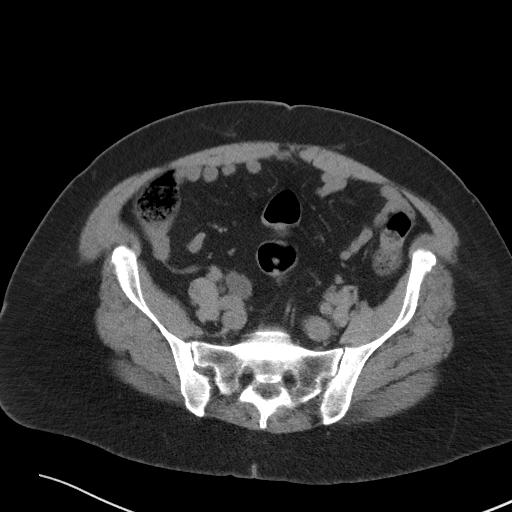
[im 38/85  soft-tissue]
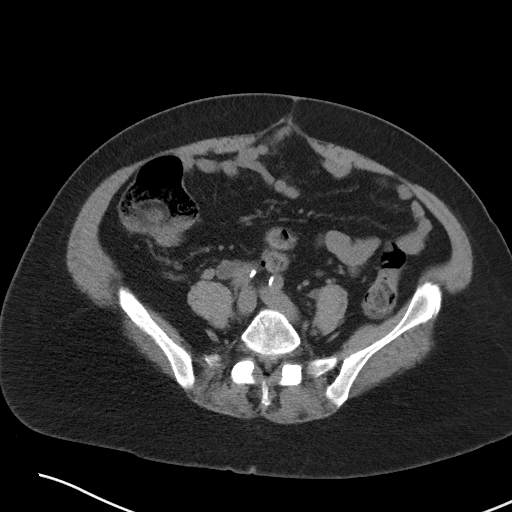
[im 47/85  soft-tissue]
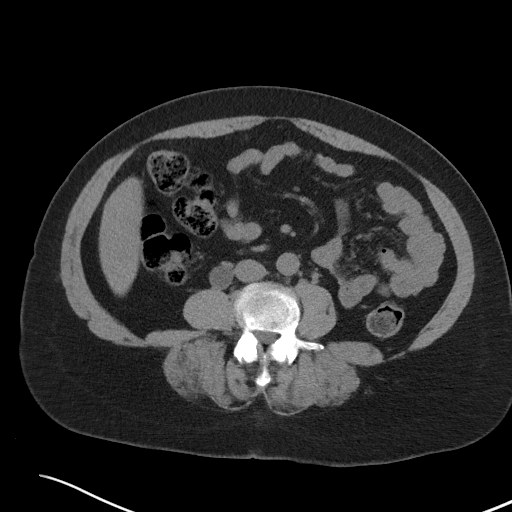
[im 52/85  soft-tissue]
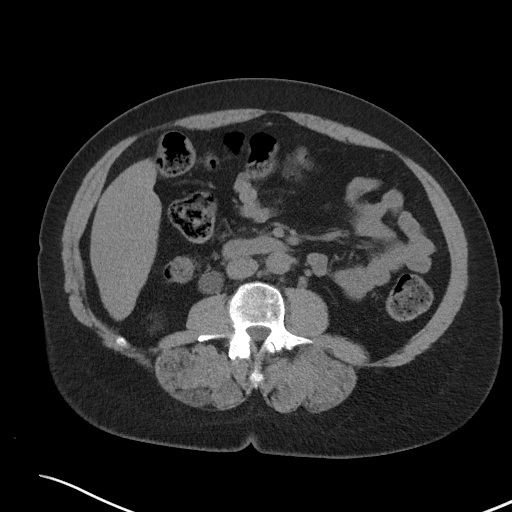
[im 61/85  soft-tissue]
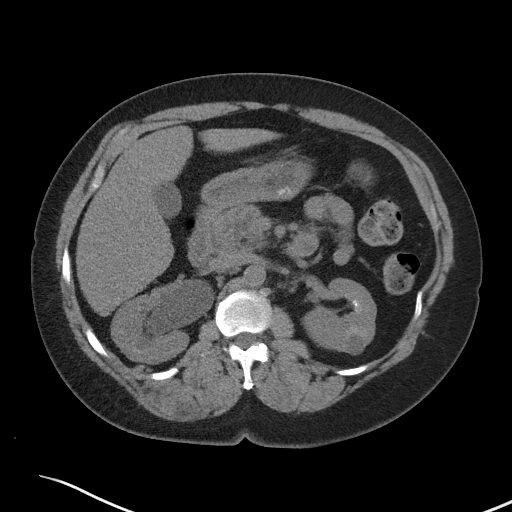
[im 61/85  bone]
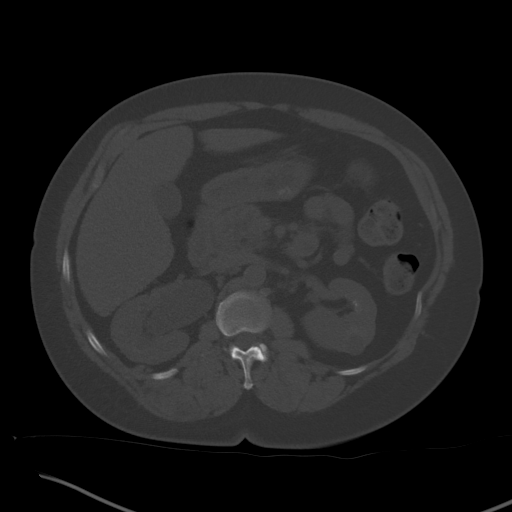
[im 66/85  soft-tissue]
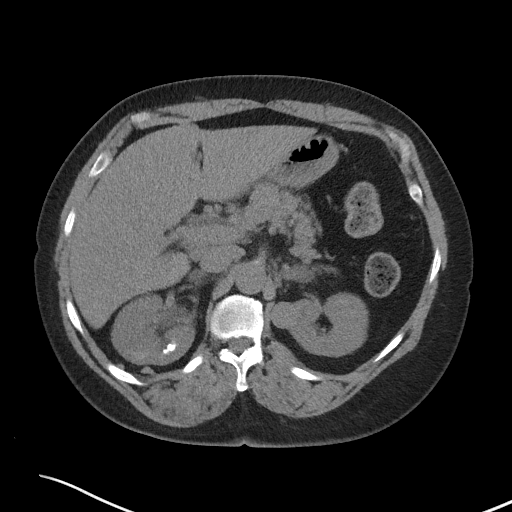
[im 71/85  soft-tissue]
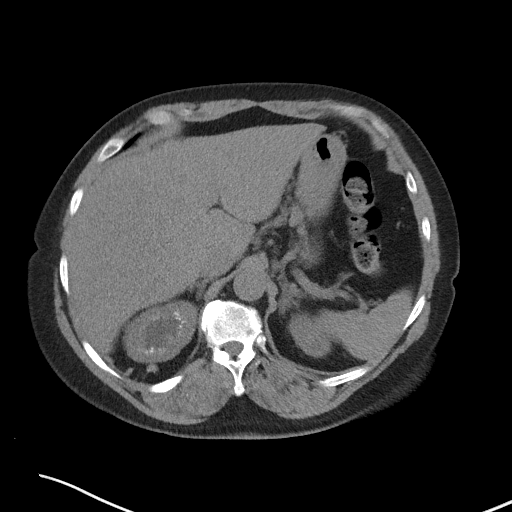
[im 80/85  soft-tissue]
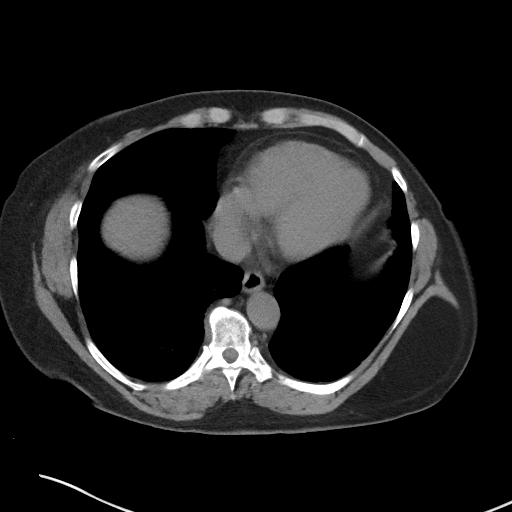

[Series 5: coronal st · coronal · 0.71mm/px · 3 of 101 slices shown]
[im 34/101  soft-tissue]
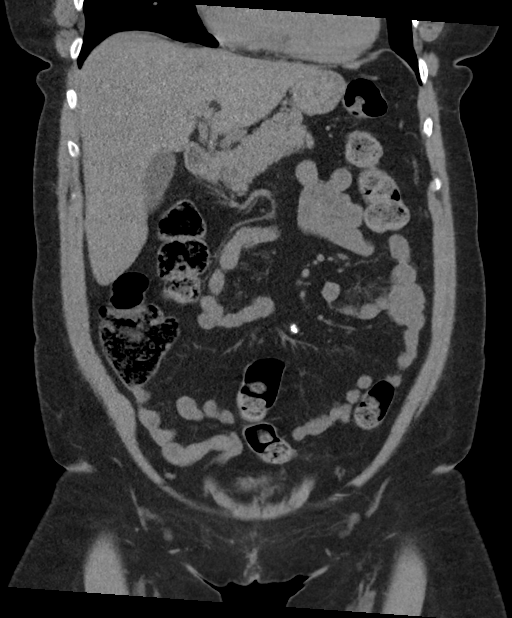
[im 45/101  soft-tissue]
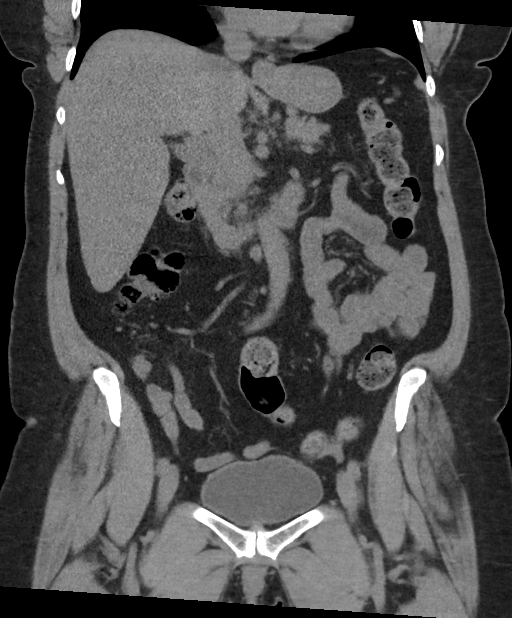
[im 56/101  soft-tissue]
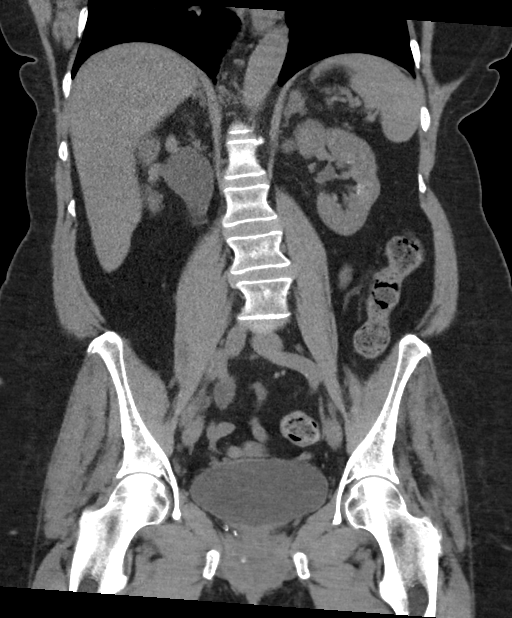

[15 of 46 positions shown; findings below may reference images not displayed]

FINDINGS: Lower chest: Minimal atelectasis at base of lingula. Posterior RIGHT
diaphragmatic defect Bochdalek's type with herniation of fat.

Hepatobiliary: 11 x 9 mm lesion within lateral segment LEFT lobe
liver consistent with tiny cyst. Gallbladder and liver otherwise
normal appearance

Pancreas: Normal appearance

Spleen: Normal appearance

Adrenals/Urinary Tract: Thickening of adrenal glands without
discrete mass. Numerous BILATERAL renal calculi. Scattered BILATERAL
renal cysts. Indeterminate intermediate attenuation lesion at medial
aspect of upper LEFT kidney, 1.9 x 1.9 cm image 20, question
hyperdense cyst versus solid mass. Moderate RIGHT hydronephrosis and
hydroureter. Three large adjacent calculi are seen within the distal
RIGHT ureter, measuring 10 mm, 12 mm, and 8 mm. Ureter beyond the
calculi remains mildly dilated to the ureterovesical junction
without additional calculus or mass identified. No LEFT
hydronephrosis or hydroureter. Bladder otherwise unremarkable.

Stomach/Bowel: Normal appendix. Stomach and bowel loops normal
appearance

Vascular/Lymphatic: Aorta normal caliber. Minimal atherosclerotic
calcifications aorta and iliac arteries without aneurysm. Scattered
pelvic phleboliths.

Reproductive: Uterus surgically absent.  Normal sized ovaries.

Other: No free air or free fluid. No hernia or inflammatory process.

Musculoskeletal: Unremarkable
IMPRESSION: Moderate RIGHT hydronephrosis and hydroureter with 3 large adjacent
calculi within the distal RIGHT ureter, measuring 10 mm, 12 mm, and
8 mm in size.

RIGHT ureter beyond the calculi remains dilated to the
ureterovesical junction without additional calculus or obvious mass.

Numerous BILATERAL renal calculi and BILATERAL renal cysts.

Indeterminate 1.9 x 1.9 cm intermediate attenuation lesion at medial
aspect of upper LEFT kidney question hyperdense cyst versus solid
mass; follow-up renal ultrasound recommended to characterize; if
ultrasound is unable to adequately characterize, than MR assessment
would be warranted.

Posterior RIGHT diaphragmatic defect Bochdalek's type with
herniation of fat.

Aortic Atherosclerosis ([id number]).
